# Patient Record
Sex: Male | Born: 1970 | Race: Black or African American | Hispanic: No | Marital: Married | Smoking: Never smoker
Health system: Southern US, Community
[De-identification: ages and names within clinical notes are randomized; demographics above are authoritative.]

## PROBLEM LIST (undated history)

## (undated) DIAGNOSIS — J45909 Unspecified asthma, uncomplicated: Secondary | ICD-10-CM

## (undated) DIAGNOSIS — F4310 Post-traumatic stress disorder, unspecified: Secondary | ICD-10-CM

## (undated) HISTORY — DX: Unspecified asthma, uncomplicated: J45.909

## (undated) HISTORY — DX: Post-traumatic stress disorder, unspecified: F43.10

---

## 1998-09-06 ENCOUNTER — Emergency Department (HOSPITAL_COMMUNITY): Admission: EM | Admit: 1998-09-06 | Discharge: 1998-09-06 | Payer: Self-pay | Admitting: Emergency Medicine

## 1998-09-06 ENCOUNTER — Encounter: Payer: Self-pay | Admitting: Emergency Medicine

## 2001-01-05 ENCOUNTER — Emergency Department (HOSPITAL_COMMUNITY): Admission: EM | Admit: 2001-01-05 | Discharge: 2001-01-05 | Payer: Self-pay | Admitting: *Deleted

## 2001-07-07 ENCOUNTER — Encounter: Admission: RE | Admit: 2001-07-07 | Discharge: 2001-07-07 | Payer: Self-pay | Admitting: *Deleted

## 2001-07-07 ENCOUNTER — Encounter: Payer: Self-pay | Admitting: *Deleted

## 2011-02-18 ENCOUNTER — Other Ambulatory Visit: Payer: Self-pay | Admitting: Orthopaedic Surgery

## 2011-02-18 DIAGNOSIS — R52 Pain, unspecified: Secondary | ICD-10-CM

## 2011-02-22 ENCOUNTER — Other Ambulatory Visit: Payer: Self-pay

## 2011-10-17 HISTORY — PX: SHOULDER SURGERY: SHX246

## 2013-04-07 DIAGNOSIS — Z0289 Encounter for other administrative examinations: Secondary | ICD-10-CM

## 2016-05-16 DEATH — deceased

## 2016-10-16 ENCOUNTER — Ambulatory Visit (INDEPENDENT_AMBULATORY_CARE_PROVIDER_SITE_OTHER): Payer: Worker's Compensation | Admitting: Family Medicine

## 2016-10-16 VITALS — BP 110/82 | HR 94 | Wt 281.0 lb

## 2016-10-16 DIAGNOSIS — M545 Low back pain: Secondary | ICD-10-CM | POA: Diagnosis not present

## 2016-10-16 DIAGNOSIS — M62830 Muscle spasm of back: Secondary | ICD-10-CM | POA: Diagnosis not present

## 2016-10-16 MED ORDER — IBUPROFEN 800 MG PO TABS: 800.0000 mg | ORAL_TABLET | Freq: Three times a day (TID) | ORAL | 0 refills | Status: DC | PRN

## 2016-10-16 MED ORDER — CYCLOBENZAPRINE HCL 10 MG PO TABS: 10.0000 mg | ORAL_TABLET | Freq: Three times a day (TID) | ORAL | 0 refills | Status: DC | PRN

## 2016-10-16 NOTE — Progress Notes (Signed)
Fred Berg 09/14/1971 45 y.o.   Chief Complaint  Patient presents with  . Back Pain    low back after lifting computers     Date of Injury: 10/16/16  History of Present Illness:  Presents for evaluation of work-related complaint.  Patient was at work today at 9:30 when he Attempted to pick up a stack of computers. He works in [employer]. 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 He does wear a back brace.  Patient states he was bent over picking up a stack of boxes when he felt sudden sharp stabbing pain left lumbar region. He had to go sit down. He was able to continue worked her in the day. However pain continued. Started by 2:30 he had to go home due to the degree of pain he was having.  He describes dull aching alternated with sharp stabbing pain bilateral lumbar region but worsened left hand side. Does not radiate. No bladder bowel incontinence. Has not tried taking anything for pain relief. No prior history of back issues or back pain. No prior back injuries.   ROS The patient denies fever, unusual weight change, decreased hearing, chest pain, palpitations, pre-syncopal or syncopal episodes, dyspnea on exertion, prolonged cough, change in walking.    Current medications and allergies reviewed and updated. Past medical history, family history, social history have been reviewed and updated.   Physical Exam Gen:  Alert, cooperative patient who appears stated age in no acute distress.  Vital signs reviewed. Head: [place]/AT.   Eyes:  EOMI, PERRL.   Ears:  External ears WNL, Bilateral TM's normal without retraction, redness or bulging. Nose:  Septum midline  Mouth:  MMM, tonsils non-erythematous, non-edematous.   MSK: Good strength bilateral upper lower extremities. Back:  Normal skin, Spine with normal alignment and no deformity.  No tenderness to vertebral process palpation.  Paraspinous muscles are tender more on the Right and  palpable spasm here.   Range of motion is full at neck and decreased forward flexion to only about 30 degrees in lumbar sacral region due to pain.  Straight leg raise is positive for ipsilateral pain BL.  Neuro:  Sensation and motor function 5/5 bilateral lower extremities.  Patellar  DTR's +1 patellar BL.  He is able to walk on his heels and toes without difficulty.  Skin:  0.4 cm in size skin tab Right lumbar region, non irritated    Assessment and Plan: 1.  Back spasm/lumbar strain: - occurred while at rest.  I did complete his worker's comp paperwork.  He carries heavy boxes at work and states that the pain is worse with walking. - Even without the heavy lifting, he still has to be on his feet most of the day.  He assumes he'd be ready to go back to work within next 5 days or so, which would be Monday.  - Recommendation is to hold from work due to degree of walking he has to do.  States there is otherwise no low-level/sedentary work.  FU on Saturday to ensure he can be cleared for Monday.   - NSAIDs and Flexeril and heat and massage for symptomatic relief. - No red flags

## 2016-10-16 NOTE — Patient Instructions (Addendum)
You have a bad muscle spasm in your lower back.  This is causing the pain you are feeling.  Take the Ibuprofen 800 mg every 8 hours or so for pain relief and anti-inflammatory effects.  Take the Flexeril as a muscle relaxer at night. This may make you drowsy and if it does do not take it during the day.  Use ice for the next day or so.  After that, Heat and massage are also great to help relieve the pain.  This can last for the next 7-10 days. If you're still having issues in the next 2 weeks come back and see us.  If you start having worsening pain despite the treatment don't wait and come back immediately.   Back Pain, Adult Back pain is very common in adults.The cause of back pain is rarely dangerous and the pain often gets better over time.The cause of your back pain may not be known. Some common causes of back pain include:  Strain of the muscles or ligaments supporting the spine.  Wear and tear (degeneration) of the spinal disks.  Arthritis.  Direct injury to the back. For many people, back pain may return. Since back pain is rarely dangerous, most people can learn to manage this condition on their own. HOME CARE INSTRUCTIONS Watch your back pain for any changes. The following actions may help to lessen any discomfort you are feeling:  Remain active. It is stressful on your back to sit or stand in one place for long periods of time. Do not sit, drive, or stand in one place for more than 30 minutes at a time. Take short walks on even surfaces as soon as you are able.Try to increase the length of time you walk each day.  Exercise regularly as directed by your health care provider. Exercise helps your back heal faster. It also helps avoid future injury by keeping your muscles strong and flexible.  Do not stay in bed.Resting more than 1-2 days can delay your recovery.  Pay attention to your body when you bend and lift. The most comfortable positions are those that put less stress  on your recovering back. Always use proper lifting techniques, including:  Bending your knees.  Keeping the load close to your body.  Avoiding twisting.  Find a comfortable position to sleep. Use a firm mattress and lie on your side with your knees slightly bent. If you lie on your back, put a pillow under your knees.  Avoid feeling anxious or stressed.Stress increases muscle tension and can worsen back pain.It is important to recognize when you are anxious or stressed and learn ways to manage it, such as with exercise.  Take medicines only as directed by your health care provider. Over-the-counter medicines to reduce pain and inflammation are often the most helpful.Your health care provider may prescribe muscle relaxant drugs.These medicines help dull your pain so you can more quickly return to your normal activities and healthy exercise.  Apply ice to the injured area:  Put ice in a plastic bag.  Place a towel between your skin and the bag.  Leave the ice on for 20 minutes, 2-3 times a day for the first 2-3 days. After that, ice and heat may be alternated to reduce pain and spasms.  Maintain a healthy weight. Excess weight puts extra stress on your back and makes it difficult to maintain good posture. SEEK MEDICAL CARE IF:  You have pain that is not relieved with rest or medicine.  You  have increasing pain going down into the legs or buttocks.  You have pain that does not improve in one week.  You have night pain.  You lose weight.  You have a fever or chills. SEEK IMMEDIATE MEDICAL CARE IF:   You develop new bowel or bladder control problems.  You have unusual weakness or numbness in your arms or legs.  You develop nausea or vomiting.  You develop abdominal pain.  You feel faint.   This information is not intended to replace advice given to you by your health care provider. Make sure you discuss any questions you have with your health care provider.   Document  Released: 12/02/2005 Document Revised: 12/23/2014 Document Reviewed: 04/05/2014 Elsevier Interactive Patient Education [employer]2016 [employer] Inc.

## 2016-10-19 ENCOUNTER — Ambulatory Visit (INDEPENDENT_AMBULATORY_CARE_PROVIDER_SITE_OTHER): Payer: Worker's Compensation | Admitting: Family Medicine

## 2016-10-19 ENCOUNTER — Ambulatory Visit (INDEPENDENT_AMBULATORY_CARE_PROVIDER_SITE_OTHER): Payer: Worker's Compensation

## 2016-10-19 VITALS — BP 112/68 | HR 62 | Temp 98.5°F | Resp 18 | Ht 72.0 in | Wt 282.0 lb

## 2016-10-19 DIAGNOSIS — S39012D Strain of muscle, fascia and tendon of lower back, subsequent encounter: Secondary | ICD-10-CM | POA: Diagnosis not present

## 2016-10-19 NOTE — Progress Notes (Signed)
Subjective:   By signing my name below, I, Fred Berg, attest that this documentation has been prepared under the direction and in the presence of Fred SorensonEva Ruthene Methvin, MD.  Electronically Signed: Andrew Auaven Berg, ED Scribe. 10/19/2016. 10:24 AM.   Patient ID: Fred Berg, male    DOB: 04/14/1971, 45 y.o.   MRN: [id number]  HPI Chief Complaint  Patient presents with  . Follow-up    wc/back pain    HPI Comments: Fred BankKevin A Revard is a 45 y.o. male who presents to the [facility] for a worker's comp follow up. Initially seen 3 days prior for injury, same day. Picking up heavy box he developed sudden sharp, stabbing lumbar pain. Over the next 5 hours pain continued to progressed. He was taken out of work for complete rest, with no walking or lifting. Advised NSAID, flexeril, heat and massage. His exam noted restricted flexion, right lumbar paraspinal spasm and right positive straight leg raise.    Since last visit pt has had improved right worse than left low back pain that he now describes as soreness. He rates initial pain at last visit 10/10 but current pain a 6-7/10. He reports pain with forward flexion with occasional radiating pain down left leg. He as been taking an NSAID 3 times a day, flexeril as well ice and heat. Although flexeril has helped with pain, he reports drowsiness with medication. He has also limited his activity with walking short distances, gentle stretches and rest.  He denies weakness and numbness in legs, bladder and bowel incontinence.   There are no active problems to display for this patient.  History reviewed. No pertinent past medical history. History reviewed. No pertinent surgical history. No Known Allergies Prior to Admission medications   Medication Sig Start Date End Date Taking? Authorizing Provider  cyclobenzaprine (FLEXERIL) 10 MG tablet Take 1 tablet (10 mg total) by mouth 3 (three) times daily as needed for muscle spasms. 10/16/16  Yes Tobey GrimJeffrey H  Walden, MD  ibuprofen (ADVIL,MOTRIN) 800 MG tablet Take 1 tablet (800 mg total) by mouth every 8 (eight) hours as needed. 10/16/16  Yes Tobey GrimJeffrey H Walden, MD   Social History   Social History  . Marital status: Married    Spouse name: N/A  . Number of children: N/A  . Years of education: N/A   Occupational History  . Not on file.   Social History Main Topics  . Smoking status: Never Smoker  . Smokeless tobacco: Never Used  . Alcohol use Yes  . Drug use: No  . Sexual activity: Not on file   Other Topics Concern  . Not on file   Social History Narrative  . No narrative on file   Review of Systems  Genitourinary: Positive for frequency. Negative for enuresis.  Musculoskeletal: Positive for back pain and myalgias. Negative for gait problem.  Neurological: Negative for weakness and numbness.   Objective:   Physical Exam  Constitutional: He is oriented to person, place, and time. He appears well-developed and well-nourished. No distress.  HENT:  Head: Normocephalic and atraumatic.  Eyes: Conjunctivae and EOM are normal.  Neck: Neck supple.  Cardiovascular: Normal rate.   Pulmonary/Chest: Effort normal.  Musculoskeletal: Normal range of motion.  No spot tenderness on lumbar spine or paraspinal muscles. No tenderness over SI. Right paraspinal tightness. 90 degrees flexion.30 degrees extension.  Mild limitation to left lateral flexion and rotation. 4+/5 on left plantar and dorsal flexion, 5/5 throughout.   Neurological: He  is alert and oriented to person, place, and time.  Skin: Skin is warm and dry.  Psychiatric: He has a normal mood and affect. His behavior is normal.  Nursing note and vitals reviewed.   Vitals:   10/19/16 0944  BP: 112/68  Pulse: 62  Resp: 18  Temp: 98.5 F (36.9 C)  TempSrc: Oral  SpO2: 98%  Weight: 282 lb (127.9 kg)  Height: 6' (1.829 m)   Dg Lumbar Spine 2-3 Views  Result Date: 10/19/2016 CLINICAL DATA:  Twisting injury at work with low back  pain. EXAM: LUMBAR SPINE - 2-3 VIEW COMPARISON:  None. FINDINGS: There is no evidence of lumbar spine fracture. Alignment is normal. Intervertebral disc spaces are maintained. IMPRESSION: Negative. Electronically Signed   By: Amie Portlandavid  Ormond M.D.   On: 10/19/2016 11:25    Assessment & Plan:    1. Strain of lumbar paraspinal muscle, subsequent encounter   Worker's comp. Ok to go back to work on [employer] (2d) on light duty. Recheck in 3-4d - hopefully to ensure he is continuing to improve and loosen work duty restrictions. Cont prn ibuprofen and flexeril.  Orders Placed This Encounter  Procedures  . DG Lumbar Spine 2-3 Views    Standing Status:   Future    Number of Occurrences:   1    Standing Expiration Date:   10/19/2017    Order Specific Question:   Reason for Exam (SYMPTOM  OR DIAGNOSIS REQUIRED)    Answer:   lumbar strain 3d prior wiht left radiculopathy/sciatica    Order Specific Question:   Preferred imaging location?    Answer:   External     I personally performed the services described in this documentation, which was scribed in my presence. The recorded information has been reviewed and considered, and addended by me as needed.   Fred SorensonEva Dannilynn Berg, M.D.  [facility]  [place] [address] [place], [place] [address] [phone] phone [phone] fax  10/21/16 12:11 AM

## 2016-10-19 NOTE — Patient Instructions (Addendum)
   IF you received an x-ray today, you will receive an invoice from [facility]. Please contact  Radiology at [phone] with questions or concerns regarding your invoice.   IF you received labwork today, you will receive an invoice from [facility]. Please contact [facility] at [phone] with questions or concerns regarding your invoice.   Our billing staff will not be able to assist you with questions regarding bills from these companies.  You will be contacted with the lab results as soon as they are available. The fastest way to get your results is to activate your My Chart account. Instructions are located on the last page of this paperwork. If you have not heard from us regarding the results in 2 weeks, please contact this office.     Low Back Strain With Rehab A strain is an injury in which a tendon or muscle is torn. The muscles and tendons of the lower back are vulnerable to strains. However, these muscles and tendons are very strong and require a great force to be injured. Strains are classified into three categories. Grade 1 strains cause pain, but the tendon is not lengthened. Grade 2 strains include a lengthened ligament, due to the ligament being stretched or partially ruptured. With grade 2 strains there is still function, although the function may be decreased. Grade 3 strains involve a complete tear of the tendon or muscle, and function is usually impaired. SYMPTOMS   Pain in the lower back.  Pain that affects one side more than the other.  Pain that gets worse with movement and may be felt in the hip, buttocks, or back of the thigh.  Muscle spasms of the muscles in the back.  Swelling along the muscles of the back.  Loss of strength of the back muscles.  Crackling sound (crepitation) when the muscles are touched. CAUSES  Lower back strains occur when a force is placed on the muscles or tendons that is greater than they  can handle. Common causes of injury include:  Prolonged overuse of the muscle-tendon units in the lower back, usually from incorrect posture.  A single violent injury or force applied to the back. RISK INCREASES WITH:  Sports that involve twisting forces on the spine or a lot of bending at the waist (football, rugby, weightlifting, bowling, golf, tennis, speed skating, racquetball, swimming, running, gymnastics, diving).  Poor strength and flexibility.  Failure to warm up properly before activity.  Family history of lower back pain or disk disorders.  Previous back injury or surgery (especially fusion).  Poor posture with lifting, especially heavy objects.  Prolonged sitting, especially with poor posture. PREVENTION   Learn and use proper posture when sitting or lifting (maintain proper posture when sitting, lift using the knees and legs, not at the waist).  Warm up and stretch properly before activity.  Allow for adequate recovery between workouts.  Maintain physical fitness:  Strength, flexibility, and endurance.  Cardiovascular fitness. PROGNOSIS  If treated properly, lower back strains usually heal within 6 weeks. RELATED COMPLICATIONS   Recurring symptoms, resulting in a chronic problem.  Chronic inflammation, scarring, and partial muscle-tendon tear.  Delayed healing or resolution of symptoms.  Prolonged disability. TREATMENT  Treatment first involves the use of ice and medicine, to reduce pain and inflammation. The use of strengthening and stretching exercises may help reduce pain with activity. These exercises may be performed at home or with a therapist. Severe injuries may require referral to a therapist for further   evaluation and treatment, such as ultrasound. Your caregiver may advise that you wear a back brace or corset, to help reduce pain and discomfort. Often, prolonged bed rest results in greater harm then benefit. Corticosteroid injections may be  recommended. However, these should be reserved for the most serious cases. It is important to avoid using your back when lifting objects. At night, sleep on your back on a firm mattress with a pillow placed under your knees. If non-surgical treatment is unsuccessful, surgery may be needed.  MEDICATION   If pain medicine is needed, nonsteroidal anti-inflammatory medicines (aspirin and ibuprofen), or other minor pain relievers (acetaminophen), are often advised.  Do not take pain medicine for 7 days before surgery.  Prescription pain relievers may be given, if your caregiver thinks they are needed. Use only as directed and only as much as you need.  Ointments applied to the skin may be helpful.  Corticosteroid injections may be given by your caregiver. These injections should be reserved for the most serious cases, because they may only be given a certain number of times. HEAT AND COLD  Cold treatment (icing) should be applied for 10 to 15 minutes every 2 to 3 hours for inflammation and pain, and immediately after activity that aggravates your symptoms. Use ice packs or an ice massage.  Heat treatment may be used before performing stretching and strengthening activities prescribed by your caregiver, physical therapist, or athletic trainer. Use a heat pack or a warm water soak. SEEK MEDICAL CARE IF:   Symptoms get worse or do not improve in 2 to 4 weeks, despite treatment.  You develop numbness, weakness, or loss of bowel or bladder function.  New, unexplained symptoms develop. (Drugs used in treatment may produce side effects.) EXERCISES  RANGE OF MOTION (ROM) AND STRETCHING EXERCISES - Low Back Strain Most people with lower back pain will find that their symptoms get worse with excessive bending forward (flexion) or arching at the lower back (extension). The exercises which will help resolve your symptoms will focus on the opposite motion.  Your physician, physical therapist or athletic  trainer will help you determine which exercises will be most helpful to resolve your lower back pain. Do not complete any exercises without first consulting with your caregiver. Discontinue any exercises which make your symptoms worse until you speak to your caregiver.  If you have pain, numbness or tingling which travels down into your buttocks, leg or foot, the goal of the therapy is for these symptoms to move closer to your back and eventually resolve. Sometimes, these leg symptoms will get better, but your lower back pain may worsen. This is typically an indication of progress in your rehabilitation. Be very alert to any changes in your symptoms and the activities in which you participated in the 24 hours prior to the change. Sharing this information with your caregiver will allow him/her to most efficiently treat your condition.  These exercises may help you when beginning to rehabilitate your injury. Your symptoms may resolve with or without further involvement from your physician, physical therapist or athletic trainer. While completing these exercises, remember:  Restoring tissue flexibility helps normal motion to return to the joints. This allows healthier, less painful movement and activity.  An effective stretch should be held for at least 30 seconds.  A stretch should never be painful. You should only feel a gentle lengthening or release in the stretched tissue. FLEXION RANGE OF MOTION AND STRETCHING EXERCISES: STRETCH - Flexion, Single Knee to Chest     Lie on a firm bed or floor with both legs extended in front of you.  Keeping one leg in contact with the floor, bring your opposite knee to your chest. Hold your leg in place by either grabbing behind your thigh or at your knee.  Pull until you feel a gentle stretch in your lower back. Hold __________ seconds.  Slowly release your grasp and repeat the exercise with the opposite side. Repeat __________ times. Complete this exercise  __________ times per day.  STRETCH - Flexion, Double Knee to Chest   Lie on a firm bed or floor with both legs extended in front of you.  Keeping one leg in contact with the floor, bring your opposite knee to your chest.  Tense your stomach muscles to support your back and then lift your other knee to your chest. Hold your legs in place by either grabbing behind your thighs or at your knees.  Pull both knees toward your chest until you feel a gentle stretch in your lower back. Hold __________ seconds.  Tense your stomach muscles and slowly return one leg at a time to the floor. Repeat __________ times. Complete this exercise __________ times per day.  STRETCH - Low Trunk Rotation  Lie on a firm bed or floor. Keeping your legs in front of you, bend your knees so they are both pointed toward the ceiling and your feet are flat on the floor.  Extend your arms out to the side. This will stabilize your upper body by keeping your shoulders in contact with the floor.  Gently and slowly drop both knees together to one side until you feel a gentle stretch in your lower back. Hold for __________ seconds.  Tense your stomach muscles to support your lower back as you bring your knees back to the starting position. Repeat the exercise to the other side. Repeat __________ times. Complete this exercise __________ times per day  EXTENSION RANGE OF MOTION AND FLEXIBILITY EXERCISES: STRETCH - Extension, Prone on Elbows   Lie on your stomach on the floor, a bed will be too soft. Place your palms about shoulder width apart and at the height of your head.  Place your elbows under your shoulders. If this is too painful, stack pillows under your chest.  Allow your body to relax so that your hips drop lower and make contact more completely with the floor.  Hold this position for __________ seconds.  Slowly return to lying flat on the floor. Repeat __________ times. Complete this exercise __________ times  per day.  RANGE OF MOTION - Extension, Prone Press Ups  Lie on your stomach on the floor, a bed will be too soft. Place your palms about shoulder width apart and at the height of your head.  Keeping your back as relaxed as possible, slowly straighten your elbows while keeping your hips on the floor. You may adjust the placement of your hands to maximize your comfort. As you gain motion, your hands will come more underneath your shoulders.  Hold this position __________ seconds.  Slowly return to lying flat on the floor. Repeat __________ times. Complete this exercise __________ times per day.  RANGE OF MOTION- Quadruped, Neutral Spine   Assume a hands and knees position on a firm surface. Keep your hands under your shoulders and your knees under your hips. You may place padding under your knees for comfort.  Drop your head and point your tail bone toward the ground below you. This will round out   your lower back like an angry cat. Hold this position for __________ seconds.  Slowly lift your head and release your tail bone so that your back sags into a large arch, like an old horse.  Hold this position for __________ seconds.  Repeat this until you feel limber in your lower back.  Now, find your "sweet spot." This will be the most comfortable position somewhere between the two previous positions. This is your neutral spine. Once you have found this position, tense your stomach muscles to support your lower back.  Hold this position for __________ seconds. Repeat __________ times. Complete this exercise __________ times per day.  STRENGTHENING EXERCISES - Low Back Strain These exercises may help you when beginning to rehabilitate your injury. These exercises should be done near your "sweet spot." This is the neutral, low-back arch, somewhere between fully rounded and fully arched, that is your least painful position. When performed in this safe range of motion, these exercises can be used  for people who have either a flexion or extension based injury. These exercises may resolve your symptoms with or without further involvement from your physician, physical therapist or athletic trainer. While completing these exercises, remember:   Muscles can gain both the endurance and the strength needed for everyday activities through controlled exercises.  Complete these exercises as instructed by your physician, physical therapist or athletic trainer. Increase the resistance and repetitions only as guided.  You may experience muscle soreness or fatigue, but the pain or discomfort you are trying to eliminate should never worsen during these exercises. If this pain does worsen, stop and make certain you are following the directions exactly. If the pain is still present after adjustments, discontinue the exercise until you can discuss the trouble with your caregiver. STRENGTHENING - Deep Abdominals, Pelvic Tilt  Lie on a firm bed or floor. Keeping your legs in front of you, bend your knees so they are both pointed toward the ceiling and your feet are flat on the floor.  Tense your lower abdominal muscles to press your lower back into the floor. This motion will rotate your pelvis so that your tail bone is scooping upwards rather than pointing at your feet or into the floor.  With a gentle tension and even breathing, hold this position for __________ seconds. Repeat __________ times. Complete this exercise __________ times per day.  STRENGTHENING - Abdominals, Crunches   Lie on a firm bed or floor. Keeping your legs in front of you, bend your knees so they are both pointed toward the ceiling and your feet are flat on the floor. Cross your arms over your chest.  Slightly tip your chin down without bending your neck.  Tense your abdominals and slowly lift your trunk high enough to just clear your shoulder blades. Lifting higher can put excessive stress on the lower back and does not further  strengthen your abdominal muscles.  Control your return to the starting position. Repeat __________ times. Complete this exercise __________ times per day.  STRENGTHENING - Quadruped, Opposite UE/LE Lift   Assume a hands and knees position on a firm surface. Keep your hands under your shoulders and your knees under your hips. You may place padding under your knees for comfort.  Find your neutral spine and gently tense your abdominal muscles so that you can maintain this position. Your shoulders and hips should form a rectangle that is parallel with the floor and is not twisted.  Keeping your trunk steady, lift your right   hand no higher than your shoulder and then your left leg no higher than your hip. Make sure you are not holding your breath. Hold this position __________ seconds.  Continuing to keep your abdominal muscles tense and your back steady, slowly return to your starting position. Repeat with the opposite arm and leg. Repeat __________ times. Complete this exercise __________ times per day.  STRENGTHENING - Lower Abdominals, Double Knee Lift  Lie on a firm bed or floor. Keeping your legs in front of you, bend your knees so they are both pointed toward the ceiling and your feet are flat on the floor.  Tense your abdominal muscles to brace your lower back and slowly lift both of your knees until they come over your hips. Be certain not to hold your breath.  Hold __________ seconds. Using your abdominal muscles, return to the starting position in a slow and controlled manner. Repeat __________ times. Complete this exercise __________ times per day.  POSTURE AND BODY MECHANICS CONSIDERATIONS - Low Back Strain Keeping correct posture when sitting, standing or completing your activities will reduce the stress put on different body tissues, allowing injured tissues a chance to heal and limiting painful experiences. The following are general guidelines for improved posture. Your physician  or physical therapist will provide you with any instructions specific to your needs. While reading these guidelines, remember:  The exercises prescribed by your provider will help you have the flexibility and strength to maintain correct postures.  The correct posture provides the best environment for your joints to work. All of your joints have less wear and tear when properly supported by a spine with good posture. This means you will experience a healthier, less painful body.  Correct posture must be practiced with all of your activities, especially prolonged sitting and standing. Correct posture is as important when doing repetitive low-stress activities (typing) as it is when doing a single heavy-load activity (lifting). RESTING POSITIONS Consider which positions are most painful for you when choosing a resting position. If you have pain with flexion-based activities (sitting, bending, stooping, squatting), choose a position that allows you to rest in a less flexed posture. You would want to avoid curling into a fetal position on your side. If your pain worsens with extension-based activities (prolonged standing, working overhead), avoid resting in an extended position such as sleeping on your stomach. Most people will find more comfort when they rest with their spine in a more neutral position, neither too rounded nor too arched. Lying on a non-sagging bed on your side with a pillow between your knees, or on your back with a pillow under your knees will often provide some relief. Keep in mind, being in any one position for a prolonged period of time, no matter how correct your posture, can still lead to stiffness. PROPER SITTING POSTURE In order to minimize stress and discomfort on your spine, you must sit with correct posture. Sitting with good posture should be effortless for a healthy body. Returning to good posture is a gradual process. Many people can work toward this most comfortably by using  various supports until they have the flexibility and strength to maintain this posture on their own. When sitting with proper posture, your ears will fall over your shoulders and your shoulders will fall over your hips. You should use the back of the chair to support your upper back. Your lower back will be in a neutral position, just slightly arched. You may place a small pillow or   folded towel at the base of your lower back for support.  When working at a desk, create an environment that supports good, upright posture. Without extra support, muscles tire, which leads to excessive strain on joints and other tissues. Keep these recommendations in mind: CHAIR:  A chair should be able to slide under your desk when your back makes contact with the back of the chair. This allows you to work closely.  The chair's height should allow your eyes to be level with the upper part of your monitor and your hands to be slightly lower than your elbows. BODY POSITION  Your feet should make contact with the floor. If this is not possible, use a foot rest.  Keep your ears over your shoulders. This will reduce stress on your neck and lower back. INCORRECT SITTING POSTURES  If you are feeling tired and unable to assume a healthy sitting posture, do not slouch or slump. This puts excessive strain on your back tissues, causing more damage and pain. Healthier options include:  Using more support, like a lumbar pillow.  Switching tasks to something that requires you to be upright or walking.  Talking a brief walk.  Lying down to rest in a neutral-spine position. PROLONGED STANDING WHILE SLIGHTLY LEANING FORWARD  When completing a task that requires you to lean forward while standing in one place for a long time, place either foot up on a stationary 2-4 inch high object to help maintain the best posture. When both feet are on the ground, the lower back tends to lose its slight inward curve. If this curve flattens (or  becomes too large), then the back and your other joints will experience too much stress, tire more quickly, and can cause pain. CORRECT STANDING POSTURES Proper standing posture should be assumed with all daily activities, even if they only take a few moments, like when brushing your teeth. As in sitting, your ears should fall over your shoulders and your shoulders should fall over your hips. You should keep a slight tension in your abdominal muscles to brace your spine. Your tailbone should point down to the ground, not behind your body, resulting in an over-extended swayback posture.  INCORRECT STANDING POSTURES  Common incorrect standing postures include a forward head, locked knees and/or an excessive swayback. WALKING Walk with an upright posture. Your ears, shoulders and hips should all line-up. PROLONGED ACTIVITY IN A FLEXED POSITION When completing a task that requires you to bend forward at your waist or lean over a low surface, try to find a way to stabilize 3 out of 4 of your limbs. You can place a hand or elbow on your thigh or rest a knee on the surface you are reaching across. This will provide you more stability so that your muscles do not fatigue as quickly. By keeping your knees relaxed, or slightly bent, you will also reduce stress across your lower back. CORRECT LIFTING TECHNIQUES DO :   Assume a wide stance. This will provide you more stability and the opportunity to get as close as possible to the object which you are lifting.  Tense your abdominals to brace your spine. Bend at the knees and hips. Keeping your back locked in a neutral-spine position, lift using your leg muscles. Lift with your legs, keeping your back straight.  Test the weight of unknown objects before attempting to lift them.  Try to keep your elbows locked down at your sides in order get the best strength from your   shoulders when carrying an object.  Always ask for help when lifting heavy or awkward  objects. INCORRECT LIFTING TECHNIQUES DO NOT:   Lock your knees when lifting, even if it is a small object.  Bend and twist. Pivot at your feet or move your feet when needing to change directions.  Assume that you can safely pick up even a paper clip without proper posture.   This information is not intended to replace advice given to you by your health care provider. Make sure you discuss any questions you have with your health care provider.   Document Released: 12/02/2005 Document Revised: 12/23/2014 Document Reviewed: 03/16/2009 [employer] Interactive Patient Education 2016 [employer]   

## 2016-10-23 ENCOUNTER — Ambulatory Visit (INDEPENDENT_AMBULATORY_CARE_PROVIDER_SITE_OTHER): Payer: Worker's Compensation | Admitting: Family Medicine

## 2016-10-23 VITALS — BP 110/68 | HR 68 | Temp 98.2°F | Resp 18 | Ht 72.0 in | Wt 288.0 lb

## 2016-10-23 DIAGNOSIS — M545 Low back pain: Secondary | ICD-10-CM | POA: Diagnosis not present

## 2016-10-23 DIAGNOSIS — M62830 Muscle spasm of back: Secondary | ICD-10-CM | POA: Diagnosis not present

## 2016-10-23 DIAGNOSIS — S39012D Strain of muscle, fascia and tendon of lower back, subsequent encounter: Secondary | ICD-10-CM | POA: Diagnosis not present

## 2016-10-23 MED ORDER — TRAMADOL HCL 50 MG PO TABS: 50.0000 mg | ORAL_TABLET | Freq: Every evening | ORAL | 0 refills | Status: DC | PRN

## 2016-10-23 NOTE — Patient Instructions (Addendum)
   IF you received an x-ray today, you will receive an invoice from [facility]. Please contact [facility] at [phone] with questions or concerns regarding your invoice.   IF you received labwork today, you will receive an invoice from [facility]. Please contact [facility] at [phone] with questions or concerns regarding your invoice.   Our billing staff will not be able to assist you with questions regarding bills from these companies.  You will be contacted with the lab results as soon as they are available. The fastest way to get your results is to activate your My Chart account. Instructions are located on the last page of this paperwork. If you have not heard from us regarding the results in 2 weeks, please contact this office.     Low Back Strain With Rehab A strain is an injury in which a tendon or muscle is torn. The muscles and tendons of the lower back are vulnerable to strains. However, these muscles and tendons are very strong and require a great force to be injured. Strains are classified into three categories. Grade 1 strains cause pain, but the tendon is not lengthened. Grade 2 strains include a lengthened ligament, due to the ligament being stretched or partially ruptured. With grade 2 strains there is still function, although the function may be decreased. Grade 3 strains involve a complete tear of the tendon or muscle, and function is usually impaired. SYMPTOMS   Pain in the lower back.  Pain that affects one side more than the other.  Pain that gets worse with movement and may be felt in the hip, buttocks, or back of the thigh.  Muscle spasms of the muscles in the back.  Swelling along the muscles of the back.  Loss of strength of the back muscles.  Crackling sound (crepitation) when the muscles are touched. CAUSES  Lower back strains occur when a force is placed on the muscles or tendons that is greater than they  can handle. Common causes of injury include:  Prolonged overuse of the muscle-tendon units in the lower back, usually from incorrect posture.  A single violent injury or force applied to the back. RISK INCREASES WITH:  Sports that involve twisting forces on the spine or a lot of bending at the waist (football, rugby, weightlifting, bowling, golf, tennis, speed skating, racquetball, swimming, running, gymnastics, diving).  Poor strength and flexibility.  Failure to warm up properly before activity.  Family history of lower back pain or disk disorders.  Previous back injury or surgery (especially fusion).  Poor posture with lifting, especially heavy objects.  Prolonged sitting, especially with poor posture. PREVENTION   Learn and use proper posture when sitting or lifting (maintain proper posture when sitting, lift using the knees and legs, not at the waist).  Warm up and stretch properly before activity.  Allow for adequate recovery between workouts.  Maintain physical fitness:  Strength, flexibility, and endurance.  Cardiovascular fitness. PROGNOSIS  If treated properly, lower back strains usually heal within 6 weeks. RELATED COMPLICATIONS   Recurring symptoms, resulting in a chronic problem.  Chronic inflammation, scarring, and partial muscle-tendon tear.  Delayed healing or resolution of symptoms.  Prolonged disability. TREATMENT  Treatment first involves the use of ice and medicine, to reduce pain and inflammation. The use of strengthening and stretching exercises may help reduce pain with activity. These exercises may be performed at home or with a therapist. Severe injuries may require referral to a therapist for further   evaluation and treatment, such as ultrasound. Your caregiver may advise that you wear a back brace or corset, to help reduce pain and discomfort. Often, prolonged bed rest results in greater harm then benefit. Corticosteroid injections may be  recommended. However, these should be reserved for the most serious cases. It is important to avoid using your back when lifting objects. At night, sleep on your back on a firm mattress with a pillow placed under your knees. If non-surgical treatment is unsuccessful, surgery may be needed.  MEDICATION   If pain medicine is needed, nonsteroidal anti-inflammatory medicines (aspirin and ibuprofen), or other minor pain relievers (acetaminophen), are often advised.  Do not take pain medicine for 7 days before surgery.  Prescription pain relievers may be given, if your caregiver thinks they are needed. Use only as directed and only as much as you need.  Ointments applied to the skin may be helpful.  Corticosteroid injections may be given by your caregiver. These injections should be reserved for the most serious cases, because they may only be given a certain number of times. HEAT AND COLD  Cold treatment (icing) should be applied for 10 to 15 minutes every 2 to 3 hours for inflammation and pain, and immediately after activity that aggravates your symptoms. Use ice packs or an ice massage.  Heat treatment may be used before performing stretching and strengthening activities prescribed by your caregiver, physical therapist, or athletic trainer. Use a heat pack or a warm water soak. SEEK MEDICAL CARE IF:   Symptoms get worse or do not improve in 2 to 4 weeks, despite treatment.  You develop numbness, weakness, or loss of bowel or bladder function.  New, unexplained symptoms develop. (Drugs used in treatment may produce side effects.) EXERCISES  RANGE OF MOTION (ROM) AND STRETCHING EXERCISES - Low Back Strain Most people with lower back pain will find that their symptoms get worse with excessive bending forward (flexion) or arching at the lower back (extension). The exercises which will help resolve your symptoms will focus on the opposite motion.  Your physician, physical therapist or athletic  trainer will help you determine which exercises will be most helpful to resolve your lower back pain. Do not complete any exercises without first consulting with your caregiver. Discontinue any exercises which make your symptoms worse until you speak to your caregiver.  If you have pain, numbness or tingling which travels down into your buttocks, leg or foot, the goal of the therapy is for these symptoms to move closer to your back and eventually resolve. Sometimes, these leg symptoms will get better, but your lower back pain may worsen. This is typically an indication of progress in your rehabilitation. Be very alert to any changes in your symptoms and the activities in which you participated in the 24 hours prior to the change. Sharing this information with your caregiver will allow him/her to most efficiently treat your condition.  These exercises may help you when beginning to rehabilitate your injury. Your symptoms may resolve with or without further involvement from your physician, physical therapist or athletic trainer. While completing these exercises, remember:  Restoring tissue flexibility helps normal motion to return to the joints. This allows healthier, less painful movement and activity.  An effective stretch should be held for at least 30 seconds.  A stretch should never be painful. You should only feel a gentle lengthening or release in the stretched tissue. FLEXION RANGE OF MOTION AND STRETCHING EXERCISES: STRETCH - Flexion, Single Knee to Chest     Lie on a firm bed or floor with both legs extended in front of you.  Keeping one leg in contact with the floor, bring your opposite knee to your chest. Hold your leg in place by either grabbing behind your thigh or at your knee.  Pull until you feel a gentle stretch in your lower back. Hold __________ seconds.  Slowly release your grasp and repeat the exercise with the opposite side. Repeat __________ times. Complete this exercise  __________ times per day.  STRETCH - Flexion, Double Knee to Chest   Lie on a firm bed or floor with both legs extended in front of you.  Keeping one leg in contact with the floor, bring your opposite knee to your chest.  Tense your stomach muscles to support your back and then lift your other knee to your chest. Hold your legs in place by either grabbing behind your thighs or at your knees.  Pull both knees toward your chest until you feel a gentle stretch in your lower back. Hold __________ seconds.  Tense your stomach muscles and slowly return one leg at a time to the floor. Repeat __________ times. Complete this exercise __________ times per day.  STRETCH - Low Trunk Rotation  Lie on a firm bed or floor. Keeping your legs in front of you, bend your knees so they are both pointed toward the ceiling and your feet are flat on the floor.  Extend your arms out to the side. This will stabilize your upper body by keeping your shoulders in contact with the floor.  Gently and slowly drop both knees together to one side until you feel a gentle stretch in your lower back. Hold for __________ seconds.  Tense your stomach muscles to support your lower back as you bring your knees back to the starting position. Repeat the exercise to the other side. Repeat __________ times. Complete this exercise __________ times per day  EXTENSION RANGE OF MOTION AND FLEXIBILITY EXERCISES: STRETCH - Extension, Prone on Elbows   Lie on your stomach on the floor, a bed will be too soft. Place your palms about shoulder width apart and at the height of your head.  Place your elbows under your shoulders. If this is too painful, stack pillows under your chest.  Allow your body to relax so that your hips drop lower and make contact more completely with the floor.  Hold this position for __________ seconds.  Slowly return to lying flat on the floor. Repeat __________ times. Complete this exercise __________ times  per day.  RANGE OF MOTION - Extension, Prone Press Ups  Lie on your stomach on the floor, a bed will be too soft. Place your palms about shoulder width apart and at the height of your head.  Keeping your back as relaxed as possible, slowly straighten your elbows while keeping your hips on the floor. You may adjust the placement of your hands to maximize your comfort. As you gain motion, your hands will come more underneath your shoulders.  Hold this position __________ seconds.  Slowly return to lying flat on the floor. Repeat __________ times. Complete this exercise __________ times per day.  RANGE OF MOTION- Quadruped, Neutral Spine   Assume a hands and knees position on a firm surface. Keep your hands under your shoulders and your knees under your hips. You may place padding under your knees for comfort.  Drop your head and point your tail bone toward the ground below you. This will round out   your lower back like an angry cat. Hold this position for __________ seconds.  Slowly lift your head and release your tail bone so that your back sags into a large arch, like an old horse.  Hold this position for __________ seconds.  Repeat this until you feel limber in your lower back.  Now, find your "sweet spot." This will be the most comfortable position somewhere between the two previous positions. This is your neutral spine. Once you have found this position, tense your stomach muscles to support your lower back.  Hold this position for __________ seconds. Repeat __________ times. Complete this exercise __________ times per day.  STRENGTHENING EXERCISES - Low Back Strain These exercises may help you when beginning to rehabilitate your injury. These exercises should be done near your "sweet spot." This is the neutral, low-back arch, somewhere between fully rounded and fully arched, that is your least painful position. When performed in this safe range of motion, these exercises can be used  for people who have either a flexion or extension based injury. These exercises may resolve your symptoms with or without further involvement from your physician, physical therapist or athletic trainer. While completing these exercises, remember:   Muscles can gain both the endurance and the strength needed for everyday activities through controlled exercises.  Complete these exercises as instructed by your physician, physical therapist or athletic trainer. Increase the resistance and repetitions only as guided.  You may experience muscle soreness or fatigue, but the pain or discomfort you are trying to eliminate should never worsen during these exercises. If this pain does worsen, stop and make certain you are following the directions exactly. If the pain is still present after adjustments, discontinue the exercise until you can discuss the trouble with your caregiver. STRENGTHENING - Deep Abdominals, Pelvic Tilt  Lie on a firm bed or floor. Keeping your legs in front of you, bend your knees so they are both pointed toward the ceiling and your feet are flat on the floor.  Tense your lower abdominal muscles to press your lower back into the floor. This motion will rotate your pelvis so that your tail bone is scooping upwards rather than pointing at your feet or into the floor.  With a gentle tension and even breathing, hold this position for __________ seconds. Repeat __________ times. Complete this exercise __________ times per day.  STRENGTHENING - Abdominals, Crunches   Lie on a firm bed or floor. Keeping your legs in front of you, bend your knees so they are both pointed toward the ceiling and your feet are flat on the floor. Cross your arms over your chest.  Slightly tip your chin down without bending your neck.  Tense your abdominals and slowly lift your trunk high enough to just clear your shoulder blades. Lifting higher can put excessive stress on the lower back and does not further  strengthen your abdominal muscles.  Control your return to the starting position. Repeat __________ times. Complete this exercise __________ times per day.  STRENGTHENING - Quadruped, Opposite UE/LE Lift   Assume a hands and knees position on a firm surface. Keep your hands under your shoulders and your knees under your hips. You may place padding under your knees for comfort.  Find your neutral spine and gently tense your abdominal muscles so that you can maintain this position. Your shoulders and hips should form a rectangle that is parallel with the floor and is not twisted.  Keeping your trunk steady, lift your right   hand no higher than your shoulder and then your left leg no higher than your hip. Make sure you are not holding your breath. Hold this position __________ seconds.  Continuing to keep your abdominal muscles tense and your back steady, slowly return to your starting position. Repeat with the opposite arm and leg. Repeat __________ times. Complete this exercise __________ times per day.  STRENGTHENING - Lower Abdominals, Double Knee Lift  Lie on a firm bed or floor. Keeping your legs in front of you, bend your knees so they are both pointed toward the ceiling and your feet are flat on the floor.  Tense your abdominal muscles to brace your lower back and slowly lift both of your knees until they come over your hips. Be certain not to hold your breath.  Hold __________ seconds. Using your abdominal muscles, return to the starting position in a slow and controlled manner. Repeat __________ times. Complete this exercise __________ times per day.  POSTURE AND BODY MECHANICS CONSIDERATIONS - Low Back Strain Keeping correct posture when sitting, standing or completing your activities will reduce the stress put on different body tissues, allowing injured tissues a chance to heal and limiting painful experiences. The following are general guidelines for improved posture. Your physician  or physical therapist will provide you with any instructions specific to your needs. While reading these guidelines, remember:  The exercises prescribed by your provider will help you have the flexibility and strength to maintain correct postures.  The correct posture provides the best environment for your joints to work. All of your joints have less wear and tear when properly supported by a spine with good posture. This means you will experience a healthier, less painful body.  Correct posture must be practiced with all of your activities, especially prolonged sitting and standing. Correct posture is as important when doing repetitive low-stress activities (typing) as it is when doing a single heavy-load activity (lifting). RESTING POSITIONS Consider which positions are most painful for you when choosing a resting position. If you have pain with flexion-based activities (sitting, bending, stooping, squatting), choose a position that allows you to rest in a less flexed posture. You would want to avoid curling into a fetal position on your side. If your pain worsens with extension-based activities (prolonged standing, working overhead), avoid resting in an extended position such as sleeping on your stomach. Most people will find more comfort when they rest with their spine in a more neutral position, neither too rounded nor too arched. Lying on a non-sagging bed on your side with a pillow between your knees, or on your back with a pillow under your knees will often provide some relief. Keep in mind, being in any one position for a prolonged period of time, no matter how correct your posture, can still lead to stiffness. PROPER SITTING POSTURE In order to minimize stress and discomfort on your spine, you must sit with correct posture. Sitting with good posture should be effortless for a healthy body. Returning to good posture is a gradual process. Many people can work toward this most comfortably by using  various supports until they have the flexibility and strength to maintain this posture on their own. When sitting with proper posture, your ears will fall over your shoulders and your shoulders will fall over your hips. You should use the back of the chair to support your upper back. Your lower back will be in a neutral position, just slightly arched. You may place a small pillow or   folded towel at the base of your lower back for support.  When working at a desk, create an environment that supports good, upright posture. Without extra support, muscles tire, which leads to excessive strain on joints and other tissues. Keep these recommendations in mind: CHAIR:  A chair should be able to slide under your desk when your back makes contact with the back of the chair. This allows you to work closely.  The chair's height should allow your eyes to be level with the upper part of your monitor and your hands to be slightly lower than your elbows. BODY POSITION  Your feet should make contact with the floor. If this is not possible, use a foot rest.  Keep your ears over your shoulders. This will reduce stress on your neck and lower back. INCORRECT SITTING POSTURES  If you are feeling tired and unable to assume a healthy sitting posture, do not slouch or slump. This puts excessive strain on your back tissues, causing more damage and pain. Healthier options include:  Using more support, like a lumbar pillow.  Switching tasks to something that requires you to be upright or walking.  Talking a brief walk.  Lying down to rest in a neutral-spine position. PROLONGED STANDING WHILE SLIGHTLY LEANING FORWARD  When completing a task that requires you to lean forward while standing in one place for a long time, place either foot up on a stationary 2-4 inch high object to help maintain the best posture. When both feet are on the ground, the lower back tends to lose its slight inward curve. If this curve flattens (or  becomes too large), then the back and your other joints will experience too much stress, tire more quickly, and can cause pain. CORRECT STANDING POSTURES Proper standing posture should be assumed with all daily activities, even if they only take a few moments, like when brushing your teeth. As in sitting, your ears should fall over your shoulders and your shoulders should fall over your hips. You should keep a slight tension in your abdominal muscles to brace your spine. Your tailbone should point down to the ground, not behind your body, resulting in an over-extended swayback posture.  INCORRECT STANDING POSTURES  Common incorrect standing postures include a forward head, locked knees and/or an excessive swayback. WALKING Walk with an upright posture. Your ears, shoulders and hips should all line-up. PROLONGED ACTIVITY IN A FLEXED POSITION When completing a task that requires you to bend forward at your waist or lean over a low surface, try to find a way to stabilize 3 out of 4 of your limbs. You can place a hand or elbow on your thigh or rest a knee on the surface you are reaching across. This will provide you more stability so that your muscles do not fatigue as quickly. By keeping your knees relaxed, or slightly bent, you will also reduce stress across your lower back. CORRECT LIFTING TECHNIQUES DO :   Assume a wide stance. This will provide you more stability and the opportunity to get as close as possible to the object which you are lifting.  Tense your abdominals to brace your spine. Bend at the knees and hips. Keeping your back locked in a neutral-spine position, lift using your leg muscles. Lift with your legs, keeping your back straight.  Test the weight of unknown objects before attempting to lift them.  Try to keep your elbows locked down at your sides in order get the best strength from your   shoulders when carrying an object.  Always ask for help when lifting heavy or awkward  objects. INCORRECT LIFTING TECHNIQUES DO NOT:   Lock your knees when lifting, even if it is a small object.  Bend and twist. Pivot at your feet or move your feet when needing to change directions.  Assume that you can safely pick up even a paper clip without proper posture.   This information is not intended to replace advice given to you by your health care provider. Make sure you discuss any questions you have with your health care provider.   Document Released: 12/02/2005 Document Revised: 12/23/2014 Document Reviewed: 03/16/2009 [employer] Interactive Patient Education 2016 [employer]   

## 2016-10-23 NOTE — Progress Notes (Signed)
Subjective:  By signing my name below, I, Raven Small, attest that this documentation has been prepared under the direction and in the presence of Norberto SorensonEva Shaw, MD.  Electronically Signed: Andrew Auaven Small, ED Scribe. 10/23/2016. 1:32 PM.   Patient ID: Fred Berg, male    DOB: 01/29/1971, 45 y.o.   MRN: [id number]  HPI Chief Complaint  Patient presents with  . Follow-up    WORKERS COMP ON LOWER BACK    HPI Comments: Fred BankKevin A Henry is a 45 y.o. male who presents to the [facility] for a worker's comp recheck. Follow up injury that occurred 10/16/16, lifting a 50 lb computer box. This is his 3rd visit. Initially he was taken out and went back to work on light duty 2 days prior with prn ibuprofen and flexeril.    Pt reports some improvement since last visit but is still not 100%. He's continues to have low back stiffness. [facility] and [facility] cream that he's found helpful for the past 3 days. He's on light duty at work and has avoided lifting heavy object and climbing steps. Reports pain keeps him awake at night. He's continued ibuprofen and flexeril regimen.  He reports some numbness in left foot as well as left knee pain.   Review of Systems  Musculoskeletal: Positive for back pain and myalgias. Negative for gait problem.  Neurological: Positive for weakness and numbness.       Objective:   Physical Exam  Constitutional: He is oriented to person, place, and time. He appears well-developed and well-nourished. No distress.  HENT:  Head: Normocephalic and atraumatic.  Eyes: Conjunctivae and EOM are normal.  Neck: Neck supple.  Cardiovascular: Normal rate.   Pulmonary/Chest: Effort normal.  Musculoskeletal: Normal range of motion.  Tenderness over L5 spinous process and L5 paraspinal bilaterally. No tenderness over the head of trochanter or SI joint. Negative straight raise bilaterally. 4+/5 strength left dorsi flexion, 5/5 throughout. Forward flexion  to 90 degrees, extension to 15 degrees.   Neurological: He is alert and oriented to person, place, and time.  Reflex Scores:      Patellar reflexes are 2+ on the right side and 2+ on the left side. Skin: Skin is warm and dry.  Psychiatric: He has a normal mood and affect. His behavior is normal.  Nursing note and vitals reviewed.   Vitals:   10/23/16 1333  BP: 110/68  Pulse: 68  Resp: 18  Temp: 98.2 F (36.8 C)  TempSrc: Oral  SpO2: 98%  Weight: 288 lb (130.6 kg)  Height: 6' (1.829 m)   Assessment & Plan:   1. Strain of lumbar paraspinal muscle, subsequent encounter   2. Acute bilateral low back pain, with sciatica presence unspecified   3. Back spasm   Cont light duty at work.  Start PT and recheck in 2 wks  Meds ordered this encounter  Medications  . traMADol (ULTRAM) 50 MG tablet    Sig: Take 1-2 tablets (50-100 mg total) by mouth at bedtime as needed.    Dispense:  30 tablet    Refill:  0    Orders Placed This Encounter  Procedures  . Ambulatory referral to Physical Therapy    Referral Priority:   Routine    Referral Type:   Physical Medicine    Referral Reason:   Specialty Services Required    Requested Specialty:   Physical Therapy    Number of Visits Requested:  1    I personally performed the services described in this documentation, which was scribed in my presence. The recorded information has been reviewed and considered, and addended by me as needed.   Norberto SorensonEva Shaw, M.D.  [facility]  [place] [address] [place], [place] [address] [phone] phone [phone] fax  10/30/16 12:00 AM

## 2016-11-04 ENCOUNTER — Ambulatory Visit: Payer: Worker's Compensation | Attending: Family Medicine

## 2016-11-04 DIAGNOSIS — M545 Low back pain, unspecified: Secondary | ICD-10-CM

## 2016-11-04 DIAGNOSIS — M2560 Stiffness of unspecified joint, not elsewhere classified: Secondary | ICD-10-CM | POA: Diagnosis present

## 2016-11-04 DIAGNOSIS — M62830 Muscle spasm of back: Secondary | ICD-10-CM | POA: Diagnosis present

## 2016-11-04 DIAGNOSIS — M6281 Muscle weakness (generalized): Secondary | ICD-10-CM | POA: Diagnosis present

## 2016-11-04 DIAGNOSIS — M25651 Stiffness of right hip, not elsewhere classified: Secondary | ICD-10-CM | POA: Diagnosis present

## 2016-11-04 DIAGNOSIS — M25652 Stiffness of left hip, not elsewhere classified: Secondary | ICD-10-CM | POA: Insufficient documentation

## 2016-11-04 NOTE — Therapy (Signed)
[facility] [address] [place], [place], [address] Phone: [phone]   Fax:  [phone]  Physical Therapy Evaluation  Patient Details  Name: Fred Berg MRN: [id number] Date of Birth: 06-30-1971 Referring Provider: Norberto Sorenson  MD  Encounter Date: 11/04/2016      PT End of Session - 11/04/16 1513    Visit Number 1   Number of Visits 12   Date for PT Re-Evaluation 12/20/16   PT Start Time 0215   PT Stop Time 0300   PT Time Calculation (min) 45 min   Activity Tolerance Patient tolerated treatment well;No increased pain   Behavior During Therapy [facility] for tasks assessed/performed      No past medical history on file.  No past surgical history on file.  There were no vitals filed for this visit.       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  Pain has eased now but feels it is slow. Using heat and cold.    Goes to gym 2x/week.    Limitations Standing;House hold activities  bending, Sitting more at work, less lifting Elba Barman.  Limiting lifting of child, bending at home   How long can you sit comfortably? 15 min   How long can you stand comfortably? 2 min   How long can you walk comfortably? 5 min   Diagnostic tests Xray:   negative   Patient Stated Goals He wants to retrain muscles.      Currently in Pain? Yes   Pain Score 7    Pain Location Back   Pain Orientation Posterior;Left;Right   Pain Descriptors / Indicators Tightness;Aching   Pain Type Acute pain   Pain Radiating Towards knee pain worse since back onset but had pain in Lt kne proior to injury   Pain Onset 1 to 4 weeks ago   Pain Frequency Constant   Aggravating Factors  see above   Pain Relieving Factors ice,Meds help some            [facility] PT Assessment - 11/04/16 0001      Assessment   Medical Diagnosis acute lumbar strain   Referring Provider Norberto Sorenson  MD   Onset Date/Surgical Date 10/16/16   Next MD Visit 11/05/16   Prior Therapy no     Precautions   Precautions None     Restrictions   Weight Bearing Restrictions No     Balance Screen   Has the patient fallen in the past 6 months No   Has the patient had a decrease in activity level because of a fear of falling?  Yes  due to injury   Is the patient reluctant to leave their home because of a fear of falling?  No     Prior Function   Level of Independence Needs assistance with homemaking     Cognition   Overall Cognitive Status Within Functional Limits for tasks assessed     Posture/Postural Control   Posture Comments WNL      ROM / Strength   AROM / PROM / Strength AROM;Strength     AROM   AROM Assessment Site Lumbar   Lumbar Flexion 45   Lumbar Extension 12   Lumbar - Right Side Bend 20   Lumbar - Left Side Bend 15     Strength   Overall Strength Comments Normal LE strength  fair abdominals  Flexibility   Soft Tissue Assessment /Muscle Length yes   Hamstrings 55 degrees bilat also decreased range with fig 4 and adduction RT and LT      Ambulation/Gait   Gait Comments WNL                   [facility] Adult PT Treatment/Exercise - 11/04/16 0001      Manual Therapy   Manual Therapy Joint mobilization;Manual Traction   Joint Mobilization anterior hip mobs Gr 3-4 With ER prone  t   Manual Traction long axis To LT leg GR 4      HEP see below           PT Education - 11/04/16 1512    Education provided Yes   Education Details POC , results of eval , HEP   Person(s) Educated Patient   Methods Explanation;Tactile cues;Verbal cues;Handout   Comprehension Returned demonstration;Verbalized understanding          PT Short Term Goals - 11/04/16 1504      PT SHORT TERM GOAL #1   Title Independent with intial HEP   Time 3   Period Weeks   Status New     PT SHORT TERM GOAL #2   Title He will report pain decreased 40% or more with normal  activity   Time 3   Period Weeks   Status New     PT SHORT TERM GOAL #3   Title He will be able to walk for 20 min or more with min pain   Time 3   Period Weeks   Status New     PT SHORT TERM GOAL #4   Title He will be able to stand for 10 min or more with min pain   Time 3   Period Weeks   Status New           PT Long Term Goals - 11/04/16 1505      PT LONG TERM GOAL #1   Title He will be independent with all HEP issued   Time 6   Period Weeks   Status New     PT LONG TERM GOAL #2   Title He will return to lifting at work modified load   Time 6   Period Weeks   Status New     PT LONG TERM GOAL #3   Title He will report able to lift child with 1-2 max pain   Time 6   Status New     PT LONG TERM GOAL #4   Title He will demo understanding of good posture sitting and with lifting   Time 6   Period Weeks   Status New     PT LONG TERM GOAL #5   Title He will be able to be on feet and walk at work from building to building   Time 6   Period Weeks   Status New     Additional Long Term Goals   Additional Long Term Goals Yes     PT LONG TERM GOAL #6   Title With proper support he sill be able to sit for 60 min for lork or leisure with 1-2 max pain   Time 6   Period Weeks   Status New               Plan - 11/04/16 1500    Clinical Impression Statement Fred Berg presents for moderate complexity eval with complaint of LT>RT LBP, spasms,  stiffness in spine and hips  , core weakness limiting activity at home and work. He reports feeling looser post session   Rehab Potential Good   PT Frequency 2x / week   PT Duration 6 weeks   PT Treatment/Interventions Electrical Stimulation;Iontophoresis 4mg /ml Dexamethasone;Moist Heat;Ultrasound;Traction;Passive range of motion;Patient/family education;Taping;Manual techniques;Therapeutic exercise;Dry needling   PT Next Visit Plan Core stability exercise, manual,    PT Home Exercise Plan Modalities , manual review  stretching , core strength for HEP   Consulted and Agree with Plan of Care Patient      Patient will benefit from skilled therapeutic intervention in order to improve the following deficits and impairments:  Decreased activity tolerance, Pain, Decreased range of motion, Increased muscle spasms, Decreased strength  Visit Diagnosis: Acute bilateral low back pain without sciatica - Plan: PT plan of care cert/re-cert  Stiffness of right hip, not elsewhere classified - Plan: PT plan of care cert/re-cert  Stiffness of left hip, not elsewhere classified - Plan: PT plan of care cert/re-cert  Joint stiffness of spine - Plan: PT plan of care cert/re-cert  Muscle spasm of back - Plan: PT plan of care cert/re-cert  Muscle weakness (generalized) - Plan: PT plan of care cert/re-cert     Problem List There are no active problems to display for this patient.   Caprice RedChasse, Athenia Rys M  PT 11/04/2016, 3:15 PM  [facility] [address] [place], [place], [address] Phone: [phone]   Fax:  [phone]  Name: Fred Berg MRN: [id number] Date of Birth: 06/20/1971

## 2016-11-04 NOTE — Patient Instructions (Signed)
From cabinet issued basic williams exercises , cat/camel, quadratus stretching   2-3x/day 2-3 reps 10-20 sec stretching.

## 2016-11-05 ENCOUNTER — Encounter: Payer: Self-pay | Admitting: Family Medicine

## 2016-11-05 ENCOUNTER — Ambulatory Visit (INDEPENDENT_AMBULATORY_CARE_PROVIDER_SITE_OTHER): Payer: Worker's Compensation | Admitting: Family Medicine

## 2016-11-05 VITALS — BP 126/82 | HR 77 | Temp 98.7°F | Resp 18 | Ht 72.0 in | Wt 287.0 lb

## 2016-11-05 DIAGNOSIS — S39012D Strain of muscle, fascia and tendon of lower back, subsequent encounter: Secondary | ICD-10-CM | POA: Diagnosis not present

## 2016-11-05 NOTE — Patient Instructions (Addendum)
   IF you received an x-ray today, you will receive an invoice from [facility]. Please contact [facility] at [phone] with questions or concerns regarding your invoice.   IF you received labwork today, you will receive an invoice from [facility]. Please contact [facility] at [phone] with questions or concerns regarding your invoice.   Our billing staff will not be able to assist you with questions regarding bills from these companies.  You will be contacted with the lab results as soon as they are available. The fastest way to get your results is to activate your My Chart account. Instructions are located on the last page of this paperwork. If you have not heard from us regarding the results in 2 weeks, please contact this office.     Low Back Strain A strain is a stretch or tear in a muscle or the strong cords of tissue that attach muscle to bone (tendons). Strains of the lower back (lumbar spine) are a common cause of low back pain. A strain occurs when muscles or tendons are torn or are stretched beyond their limits. The muscles may become inflamed, resulting in pain and sudden muscle tightening (spasms). A strain can happen suddenly due to an injury (trauma), or it can develop gradually due to overuse. There are three types of strains:  Grade 1 is a mild strain involving a minor tear of the muscle fibers or tendons. This may cause some pain but no loss of muscle strength.  Grade 2 is a moderate strain involving a partial tear of the muscle fibers or tendons. This causes more severe pain and some loss of muscle strength.  Grade 3 is a severe strain involving a complete tear of the muscle or tendon. This causes severe pain and complete or nearly complete loss of muscle strength. What are the causes? This condition may be caused by:  Trauma, such as a fall or a hit to the body.  Twisting or overstretching the back. This may result  from doing activities that require a lot of energy, such as lifting heavy objects. What increases the risk? The following factors may increase your risk of getting this condition:  Playing contact sports.  Participating in sports or activities that put excessive stress on the back and require a lot of bending and twisting, including:  Lifting weights or heavy objects.  Gymnastics.  Soccer.  Figure skating.  Snowboarding.  Being overweight or obese.  Having poor strength and flexibility. What are the signs or symptoms? Symptoms of this condition may include:  Sharp or dull pain in the lower back that does not go away. Pain may extend to the buttocks.  Stiffness.  Limited range of motion.  Inability to stand up straight due to stiffness or pain.  Muscle spasms. How is this diagnosed?   This condition may be diagnosed based on:  Your symptoms.  Your medical history.  A physical exam.  Your health care provider may push on certain areas of your back to determine the source of your pain.  You may be asked to bend forward, backward, and side to side to assess the severity of your pain and your range of motion.  Imaging tests, such as:  X-rays.  MRI. How is this treated? Treatment for this condition may include:  Applying heat and cold to the affected area.  Medicines to help relieve pain and to relax your muscles (muscle relaxants).  NSAIDs to help reduce swelling and discomfort.    Physical therapy. When your symptoms improve, it is important to gradually return to your normal routine as soon as possible to reduce pain, avoid stiffness, and avoid loss of muscle strength. Generally, symptoms should improve within 6 weeks of treatment. However, recovery time varies. Follow these instructions at home: Managing pain, stiffness, and swelling  If directed, apply ice to the injured area during the first 24 hours after your injury.  Put ice in a plastic  bag.  Place a towel between your skin and the bag.  Leave the ice on for 20 minutes, 2-3 times a day.  If directed, apply heat to the affected area as often as told by your health care provider. Use the heat source that your health care provider recommends, such as a moist heat pack or a heating pad.  Place a towel between your skin and the heat source.  Leave the heat on for 20-30 minutes.  Remove the heat if your skin turns bright red. This is especially important if you are unable to feel pain, heat, or cold. You may have a greater risk of getting burned. Activity  Rest and return to your normal activities as told by your health care provider. Ask your health care provider what activities are safe for you.  Avoid activities that take a lot of effort (are strenuous) for as long as told by your health care provider.  Do exercises as told by your health care provider. General instructions  Take over-the-counter and prescription medicines only as told by your health care provider.  If you have questions or concerns about safety while taking pain medicine, talk with your health care provider.  Do not drive or operate heavy machinery until you know how your pain medicine affects you.  Do not use any tobacco products, such as cigarettes, chewing tobacco, and e-cigarettes. Tobacco can delay bone healing. If you need help quitting, ask your health care provider.  Keep all follow-up visits as told by your health care provider. This is important. How is this prevented?  Warm up and stretch before being active.  Cool down and stretch after being active.  Give your body time to rest between periods of activity.  Avoid:  Being physically inactive for long periods at a time.  Exercising or playing sports when you are tired or in pain.  Use correct form when playing sports and lifting heavy objects.  Use good posture when sitting and standing.  Maintain a healthy weight.  Sleep  on a mattress with medium firmness to support your back.  Make sure to use equipment that fits you, including shoes that fit well.  Be safe and responsible while being active to avoid falls.  Do at least 150 minutes of moderate-intensity exercise each week, such as brisk walking or water aerobics. Try a form of exercise that takes stress off your back, such as swimming or stationary cycling.  Maintain physical fitness, including:  Strength.  Flexibility.  Cardiovascular fitness.  Endurance. Contact a health care provider if:  Your back pain does not improve after 6 weeks of treatment.  Your symptoms get worse. Get help right away if:  Your back pain is severe.  You are unable to stand or walk.  You develop pain in your legs.  You develop weakness in your buttocks or legs.  You have difficulty controlling when you urinate or when you have a bowel movement. This information is not intended to replace advice given to you by your health care provider.   Make sure you discuss any questions you have with your health care provider. Document Released: 12/02/2005 Document Revised: 08/08/2016 Document Reviewed: 09/13/2015 Elsevier Interactive Patient Education  2017 [employer] Inc[employer]  

## 2016-11-05 NOTE — Progress Notes (Signed)
Subjective:  By signing my name below, I, Fred Berg, attest that this documentation has been prepared under the direction and in the presence of Norberto SorensonEva Mylinh Cragg, MD Electronically Signed: Charline BillsEssence Berg, ED Scribe 11/05/2016 at 8:22 AM.   Patient ID: Fred Berg, male    DOB: 09/06/1971, 45 y.o.   MRN: [id number] Chief Complaint  Patient presents with  . Follow-up    WORKERS COMP ON LOWER BACK   HPI HPI Comments: Fred BankKevin A Berg is a 45 y.o. male who presents to the [facility] for a worker's comp reevaluation. Pt's 4th visit here for back injury that occurred on 10/16/16 when he was lifting a 50 lb box. He's been using ibuprofen and Flexeril in addition to Tramadol. Has been on light duty at work. Was referred to PT at last visit 2 weeks prior. He had his initial PT visit yesterday. Today, pt states that pain medication, stretching and ice has allowed him to tolerate back pain. He is currently taking ibuprofen x2 daily. Pt states back pain radiates into his right buttock but not in his lower extremities. He states that PT recommended dry needling and he is currently seeing PT x2 weekly.   Review of Systems  Constitutional: Positive for activity change. Negative for chills and fever.  Gastrointestinal: Negative for abdominal pain, nausea and vomiting.  Genitourinary: Negative for decreased urine volume, difficulty urinating, frequency and urgency.  Musculoskeletal: Positive for back pain, gait problem and myalgias.  Skin: Negative for color change and wound.  Neurological: Positive for weakness and numbness.  Hematological: Does not bruise/bleed easily.  Psychiatric/Behavioral: Positive for sleep disturbance.  BP 126/82 (BP Location: Right Arm, Patient Position: Sitting, Cuff Size: Large)   Pulse 77   Temp 98.7 F (37.1 C) (Oral)   Resp 18   Ht 6' (1.829 m)   Wt 287 lb (130.2 kg)   SpO2 (!) 7%   PF 97 L/min   BMI 38.92 kg/m     Objective:   Physical Exam    Constitutional: He is oriented to person, place, and time. He appears well-developed and well-nourished. No distress.  HENT:  Head: Normocephalic and atraumatic.  Eyes: Conjunctivae and EOM are normal.  Neck: Neck supple. No tracheal deviation present.  Cardiovascular: Normal rate.   Pulmonary/Chest: Effort normal. No respiratory distress.  Musculoskeletal: Normal range of motion.  LE strength 5/5. Excellent ROM in forward flexion of the spine and excellent extension. Excellent lateral rotation. NO Tenderness to palpation over the lumbar spinous process over paraspinal muscles.   Neurological: He is alert and oriented to person, place, and time.  Reflex Scores:      Patellar reflexes are 1+ on the right side. Trace L patellar DTR. Unable to illicit the achilles   Skin: Skin is warm and dry.  Psychiatric: He has a normal mood and affect. His behavior is normal.  Nursing note and vitals reviewed.     Assessment & Plan:   1. Strain of lumbar paraspinal muscle, subsequent encounter   Gradually healing, expect will take another 4-6 wks until he is back at 100%. Cont PT with prn ibuprofen 1-2x/d, flexeril qhs and tramadol for breakThrough pain. Continue light duty at work. Recheck in 3 weeks.   I personally performed the services described in this documentation, which was scribed in my presence. The recorded information has been reviewed and considered, and addended by me as needed.   Norberto SorensonEva Casimer Russett, M.D.  [facility]  [facility] [address] [place], [place] [address] [phone] phone [phone] fax  11/05/16 10:04 AM

## 2016-11-13 ENCOUNTER — Ambulatory Visit: Payer: Worker's Compensation | Attending: Family Medicine

## 2016-11-13 DIAGNOSIS — M545 Low back pain, unspecified: Secondary | ICD-10-CM

## 2016-11-13 DIAGNOSIS — M2560 Stiffness of unspecified joint, not elsewhere classified: Secondary | ICD-10-CM | POA: Insufficient documentation

## 2016-11-13 DIAGNOSIS — M25651 Stiffness of right hip, not elsewhere classified: Secondary | ICD-10-CM | POA: Insufficient documentation

## 2016-11-13 DIAGNOSIS — M6281 Muscle weakness (generalized): Secondary | ICD-10-CM | POA: Diagnosis present

## 2016-11-13 DIAGNOSIS — M25652 Stiffness of left hip, not elsewhere classified: Secondary | ICD-10-CM | POA: Diagnosis present

## 2016-11-13 DIAGNOSIS — M62830 Muscle spasm of back: Secondary | ICD-10-CM | POA: Diagnosis present

## 2016-11-13 NOTE — Therapy (Signed)
[facility] [address] [place], [place], [address] Phone: [phone]   Fax:  [phone]  Physical Therapy Treatment  Patient Details  Name: Fred Berg: [id number] Date of Birth: 09/28/1971 Referring Provider: Norberto SorensonEva Shaw  MD  Encounter Date: 11/13/2016      PT End of Session - 11/13/16 0943    Visit Number 2   Number of Visits 12   Date for PT Re-Evaluation 12/20/16   PT Start Time 0845   PT Stop Time 0930   PT Time Calculation (min) 45 min   Activity Tolerance Patient tolerated treatment well   Behavior During Therapy [facility] for tasks assessed/performed      No past medical history on file.  No past surgical history on file.  There were no vitals filed for this visit.      Subjective Assessment - 11/13/16 0852    Subjective Feel less stiff but legs still feel weak especially on stairs                         [facility] Treatment/Exercise - 11/13/16 0853      Exercises   Exercises Lumbar     Lumbar Exercises: Stretches   Passive Hamstring Stretch 2 reps;30 seconds   Passive Hamstring Stretch Limitations with strap   Lower Trunk Rotation 2 reps;10 seconds   Pelvic Tilt --  10 reps 5 sec   Piriformis Stretch 2 reps;30 seconds  RT/LT     Lumbar Exercises: Aerobic   Stationary Bike Nustep :L 5 6 min UE and LE     Lumbar Exercises: Quadruped   Madcat/Old Horse 15 reps   Other Quadruped Lumbar Exercises childs pose x 3 20 sec  (he is very stiff)     Manual Therapy   Joint Mobilization anterior hip mobs Gr 3-4 With ER prone  Rt and LT  and IR stretching and posterio mobs to each hip grade4  ppsterior glide and PA glides to lumbar spine GR 4 2 bouts 45-50 reps   Manual Traction long axis To LT/RT  leg GR 4                   PT Short Term Goals - 11/04/16 1504      PT SHORT TERM GOAL #1   Title Independent with intial HEP   Time 3   Period Weeks   Status New     PT  SHORT TERM GOAL #2   Title He will report pain decreased 40% or more with normal activity   Time 3   Period Weeks   Status New     PT SHORT TERM GOAL #3   Title He will be able to walk for 20 min or more with min pain   Time 3   Period Weeks   Status New     PT SHORT TERM GOAL #4   Title He will be able to stand for 10 min or more with min pain   Time 3   Period Weeks   Status New           PT Long Term Goals - 11/04/16 1505      PT LONG TERM GOAL #1   Title He will be independent with all HEP issued   Time 6   Period Weeks   Status New     PT LONG TERM GOAL #2   Title He will return to  lifting at work modified load   Time 6   Period Weeks   Status New     PT LONG TERM GOAL #3   Title He will report able to lift child with 1-2 max pain   Time 6   Status New     PT LONG TERM GOAL #4   Title He will demo understanding of good posture sitting and with lifting   Time 6   Period Weeks   Status New     PT LONG TERM GOAL #5   Title He will be able to be on feet and walk at work from building to building   Time 6   Period Weeks   Status New     Additional Long Term Goals   Additional Long Term Goals Yes     PT LONG TERM GOAL #6   Title With proper support he sill be able to sit for 60 min for lork or leisure with 1-2 max pain   Time 6   Period Weeks   Status New               Plan - 11/13/16 0859    Clinical Impression Statement He reported feeling better after the session looser with minimal pain. Need to add some stabilization exercises for home.    PT Treatment/Interventions Electrical Stimulation;Iontophoresis 4mg /ml Dexamethasone;Moist Heat;Ultrasound;Traction;Passive range of motion;Patient/family education;Taping;Manual techniques;Therapeutic exercise;Dry needling   PT Next Visit Plan Core stability exercise for home, manual,    PT Home Exercise Plan  stretching ,    Consulted and Agree with Plan of Care Patient      Patient will benefit  from skilled therapeutic intervention in order to improve the following deficits and impairments:  Decreased activity tolerance, Pain, Decreased range of motion, Increased muscle spasms, Decreased strength  Visit Diagnosis: Acute bilateral low back pain without sciatica  Stiffness of right hip, not elsewhere classified  Stiffness of left hip, not elsewhere classified  Joint stiffness of spine  Muscle spasm of back  Muscle weakness (generalized)     Problem List There are no active problems to display for this patient.   Caprice RedChasse, Layla Gramm M  PT 11/13/2016, 9:47 AM  [facility] [address] [place], [place], [address] Phone: [phone]   Fax:  [phone]  Name: Fred Berg: [id number] Date of Birth: 10/22/1971

## 2016-11-15 ENCOUNTER — Ambulatory Visit: Payer: Worker's Compensation | Attending: Family Medicine

## 2016-11-15 DIAGNOSIS — M545 Low back pain, unspecified: Secondary | ICD-10-CM

## 2016-11-15 DIAGNOSIS — M2560 Stiffness of unspecified joint, not elsewhere classified: Secondary | ICD-10-CM | POA: Diagnosis present

## 2016-11-15 DIAGNOSIS — M25651 Stiffness of right hip, not elsewhere classified: Secondary | ICD-10-CM | POA: Diagnosis present

## 2016-11-15 DIAGNOSIS — M62830 Muscle spasm of back: Secondary | ICD-10-CM

## 2016-11-15 DIAGNOSIS — M25652 Stiffness of left hip, not elsewhere classified: Secondary | ICD-10-CM | POA: Diagnosis present

## 2016-11-15 DIAGNOSIS — M6281 Muscle weakness (generalized): Secondary | ICD-10-CM | POA: Diagnosis present

## 2016-11-15 NOTE — Therapy (Signed)
[name], [name], [address] Phone: [phone]   Fax:  [phone]  Physical Therapy Treatment  Patient Details  Name: Fred Berg MRN: [id number] Date of Birth: 01-Jun-1971 Referring Provider: Delman Cheadle  MD  Encounter Date: 11/15/2016      PT End of Session - 11/15/16 0749    Visit Number 3   Number of Visits 12   Date for PT Re-Evaluation 12/20/16   PT Start Time 0747   PT Stop Time 0836   PT Time Calculation (min) 49 min   Activity Tolerance Patient tolerated treatment well   Behavior During Therapy [facility] for tasks assessed/performed      No past medical history on file.  No past surgical history on file.  There were no vitals filed for this visit.      Subjective Assessment - 11/15/16 0749    Subjective More stiffness today rather than pain   Currently in Pain? Yes   Pain Score 2    Pain Location Back   Pain Orientation Right;Left;Posterior   Pain Descriptors / Indicators Aching;Tightness   Pain Type --  sub acute   Pain Onset 1 to 4 weeks ago   Pain Frequency Constant   Aggravating Factors  standing heavier activity   Pain Relieving Factors ice meds stretching   Effect of Pain on Daily Activities still limiting lifting at work   Multiple Pain Sites No                         [facility] Adult PT Treatment/Exercise - 11/15/16 0001      Lumbar Exercises: Aerobic   Stationary Bike Nustep :L 5 6 min UE and LE     Lumbar Exercises: Supine   Bridge Limitations shoulder bridge with emphasis on segmental motion   Other Supine Lumbar Exercises Hip stretches butterfly, fig 4 with flexion and push down  for IR and ER,   piriformis  and adductor stretch x/day 30-60 sec 2-3 reps RT and Lt leg     Manual Therapy   Joint Mobilization anterior hip mobs Gr 3-4 With ER prone  Rt and LT  and IR stretching and posterior mobs to each hip grade4  posterior glide and PA glides to lumbar  spine GR 4 2 bouts 45-50 reps                PT Education - 11/15/16 0838    Education provided Yes   Education Details misc hip stretching sheet  and shoulder bridge   Person(s) Educated Patient   Methods Explanation;Demonstration;Tactile cues;Verbal cues;Handout   Comprehension Returned demonstration;Verbalized understanding          PT Short Term Goals - 11/15/16 0840      PT SHORT TERM GOAL #1   Title Independent with intial HEP   Status Achieved     PT SHORT TERM GOAL #2   Title He will report pain decreased 40% or more with normal activity   Baseline varies but better   Status Partially Met           PT Long Term Goals - 11/04/16 1505      PT LONG TERM GOAL #1   Title He will be independent with all HEP issued   Time 6   Period Weeks   Status New     PT LONG TERM GOAL #2   Title He will return to lifting at work modified  load   Time 6   Period Weeks   Status New     PT LONG TERM GOAL #3   Title He will report able to lift child with 1-2 max pain   Time 6   Status New     PT LONG TERM GOAL #4   Title He will demo understanding of good posture sitting and with lifting   Time 6   Period Weeks   Status New     PT LONG TERM GOAL #5   Title He will be able to be on feet and walk at work from building to building   Time 6   Period Weeks   Status New     Additional Long Term Goals   Additional Long Term Goals Yes     PT LONG TERM GOAL #6   Title With proper support he sill be able to sit for 60 min for lork or leisure with 1-2 max pain   Time 6   Period Weeks   Status New               Plan - 11/15/16 0839    Clinical Impression Statement Pt reports looser post session and agreed to do the new stretches and bridge exercise.   PT Treatment/Interventions Electrical Stimulation;Iontophoresis 59m/ml Dexamethasone;Moist Heat;Ultrasound;Traction;Passive range of motion;Patient/family education;Taping;Manual techniques;Therapeutic  exercise;Dry needling   PT Next Visit Plan Core stability exercise for home, manual,  Goal assessment, lifting mechanics   PT Home Exercise Plan  misc hip stretches from sheet and shoulder bridge   Consulted and Agree with Plan of Care Patient      Patient will benefit from skilled therapeutic intervention in order to improve the following deficits and impairments:  Decreased activity tolerance, Pain, Decreased range of motion, Increased muscle spasms, Decreased strength  Visit Diagnosis: Acute bilateral low back pain without sciatica  Stiffness of right hip, not elsewhere classified  Stiffness of left hip, not elsewhere classified  Muscle spasm of back  Joint stiffness of spine  Muscle weakness (generalized)     Problem List There are no active problems to display for this patient.   CDarrel HooverPT 11/15/2016, 8:42 AM  [facility] [address]Phone: [phone]  Fax:  [phone] Name: Fred SHACKLETONMRN: [id number] of Birth: 308-21-1972

## 2016-11-15 NOTE — Patient Instructions (Signed)
Misc. hip stretches from sheet from cabinet 2x/day 30-60 sec x 2 reps RT and LT supine and standing  As able

## 2016-11-20 ENCOUNTER — Ambulatory Visit: Payer: Worker's Compensation

## 2016-11-20 DIAGNOSIS — M545 Low back pain, unspecified: Secondary | ICD-10-CM

## 2016-11-20 DIAGNOSIS — M25651 Stiffness of right hip, not elsewhere classified: Secondary | ICD-10-CM

## 2016-11-20 DIAGNOSIS — M25652 Stiffness of left hip, not elsewhere classified: Secondary | ICD-10-CM

## 2016-11-20 DIAGNOSIS — M62830 Muscle spasm of back: Secondary | ICD-10-CM

## 2016-11-20 DIAGNOSIS — M2560 Stiffness of unspecified joint, not elsewhere classified: Secondary | ICD-10-CM

## 2016-11-20 NOTE — Therapy (Signed)
[facility] [address] [place], [place], [address] Phone: [phone]   Fax:  [phone]  Physical Therapy Treatment  Patient Details  Name: Fred Berg MRN: [id number] Date of Birth: [birth year] Referring Provider: Norberto Sorenson  MD  Encounter Date: 11/20/2016      PT End of Session - 11/20/16 0752    Visit Number 4   Number of Visits 12   Date for PT Re-Evaluation 12/20/16   PT Start Time 0753   PT Stop Time 0840   PT Time Calculation (min) 47 min   Activity Tolerance Patient tolerated treatment well   Behavior During Therapy [facility] for tasks assessed/performed      No past medical history on file.  No past surgical history on file.  There were no vitals filed for this visit.      Subjective Assessment - 11/20/16 0753    Subjective Stiff . Stretched this AM to get going. Initially pain was more on LT but now its on RT.   Currently in Pain? --  REports more stiffness than pain                         [facility] Adult PT Treatment/Exercise - 11/20/16 0801      Therapeutic Activites    Therapeutic Activities Lifting   Lifting box with handles from 8 inches off ground he demo good lifting technique lifts x1 8 pounds/18 pounds/28 pounds/ 33 pounds all with good technique and last 2 weight s lifted 5 reps each.  He reported muscle strain but no pain with the last 2 weights.      Lumbar Exercises: Stretches   Passive Hamstring Stretch 1 rep;60 seconds  RT/LT   Single Knee to Chest Stretch 1 rep;60 seconds  RT /LT   Pelvic Tilt --  15 reps 3 sec  cued to do smoothly   Piriformis Stretch 1 rep;60 seconds  RT/LT     Lumbar Exercises: Aerobic   Stationary Bike Nustep :L 5 6 min UE and LE     Lumbar Exercises: Quadruped   Madcat/Old Horse 10 reps   Opposite Arm/Leg Raise 15 reps;Right arm/Left leg;Left arm/Right leg;3 seconds   Other Quadruped Lumbar Exercises childs pose x 3 30 sec       Manual Therapy    Joint Mobilization anterior hip mobs Gr 3-4 With ER prone  Rt and LT  and IR stretching and posterior mobs to each hip grade4  posterior glide and PA glides to lumbar spine GR 4 2 bouts 45-50 reps   Manual Traction long axis To LT/RT  leg GR 4                 PT Education - 11/20/16 0839    Education provided Yes   Education Details lifting technique   Person(s) Educated Patient   Methods Explanation;Demonstration   Comprehension Returned demonstration;Verbalized understanding          PT Short Term Goals - 11/20/16 0802      PT SHORT TERM GOAL #1   Title Independent with intial HEP   Status Achieved     PT SHORT TERM GOAL #2   Title He will report pain decreased 40% or more with normal activity   Baseline varies but better   Status Partially Met     PT SHORT TERM GOAL #3   Title He will be able to walk for 20 min or more with min  pain   Baseline 10 min   Status On-going     PT SHORT TERM GOAL #4   Title He will be able to stand for 10 min or more with min pain   Baseline 5 Min   Status On-going           PT Long Term Goals - 11/04/16 1505      PT LONG TERM GOAL #1   Title He will be independent with all HEP issued   Time 6   Period Weeks   Status New     PT LONG TERM GOAL #2   Title He will return to lifting at work modified load   Time 6   Period Weeks   Status New     PT LONG TERM GOAL #3   Title He will report able to lift child with 1-2 max pain   Time 6   Status New     PT LONG TERM GOAL #4   Title He will demo understanding of good posture sitting and with lifting   Time 6   Period Weeks   Status New     PT LONG TERM GOAL #5   Title He will be able to be on feet and walk at work from building to building   Time 6   Period Weeks   Status New     Additional Long Term Goals   Additional Long Term Goals Yes     PT LONG TERM GOAL #6   Title With proper support he sill be able to sit for 60 min for lork or leisure with 1-2 max  pain   Time 6   Period Weeks   Status New               Plan - 11/20/16 0753    Clinical Impression Statement Good lifting technique with load off floor  and tolerated 33 pounds but may be safer with 25 pounds max if repetative lifting.     PT Treatment/Interventions Electrical Stimulation;Iontophoresis 25m/ml Dexamethasone;Moist Heat;Ultrasound;Traction;Passive range of motion;Patient/family education;Taping;Manual techniques;Therapeutic exercise;Dry needling   PT Next Visit Plan continue manual , core strength stretching , lifting  add quadraped exerc to HEP   PT Home Exercise Plan  misc hip stretches from sheet and shoulder bridge   Consulted and Agree with Plan of Care Patient      Patient will benefit from skilled therapeutic intervention in order to improve the following deficits and impairments:  Decreased activity tolerance, Pain, Decreased range of motion, Increased muscle spasms, Decreased strength  Visit Diagnosis: Acute bilateral low back pain without sciatica  Stiffness of right hip, not elsewhere classified  Stiffness of left hip, not elsewhere classified  Muscle spasm of back  Joint stiffness of spine     Problem List There are no active problems to display for this patient.   CDarrel Hoover12/05/2016, 8:43 AM  [facility] [place] [address]Phone: [phone]  Fax:  [phone] Name: Fred GATESMRN: [id number] of Birth: [birth year]

## 2016-11-22 ENCOUNTER — Ambulatory Visit: Payer: Worker's Compensation | Admitting: Physical Therapy

## 2016-11-22 DIAGNOSIS — M25651 Stiffness of right hip, not elsewhere classified: Secondary | ICD-10-CM

## 2016-11-22 DIAGNOSIS — M545 Low back pain, unspecified: Secondary | ICD-10-CM

## 2016-11-22 DIAGNOSIS — M25652 Stiffness of left hip, not elsewhere classified: Secondary | ICD-10-CM

## 2016-11-22 DIAGNOSIS — M62830 Muscle spasm of back: Secondary | ICD-10-CM

## 2016-11-22 DIAGNOSIS — M6281 Muscle weakness (generalized): Secondary | ICD-10-CM

## 2016-11-22 DIAGNOSIS — M2560 Stiffness of unspecified joint, not elsewhere classified: Secondary | ICD-10-CM

## 2016-11-22 NOTE — Therapy (Signed)
[place], [name], [address] Phone: [phone]   Fax:  [phone]  Physical Therapy Treatment  Patient Details  Name: GABRIELA GIANNELLI MRN: [id number] Date of Birth: February 19, 1971 Referring Provider: Delman Cheadle  MD  Encounter Date: 11/22/2016      PT End of Session - 11/22/16 0816    Visit Number 5   Number of Visits 12   Date for PT Re-Evaluation 12/20/16   PT Start Time 0804   PT Stop Time 6962   PT Time Calculation (min) 51 min   Activity Tolerance Patient tolerated treatment well   Behavior During Therapy [facility] for tasks assessed/performed      No past medical history on file.  No past surgical history on file.  There were no vitals filed for this visit.      Subjective Assessment - 11/22/16 0806    Subjective Used the heating pad already, R>L , stiffness.     Currently in Pain? Yes   Pain Score 5    Pain Location Back   Pain Orientation Right   Pain Descriptors / Indicators Aching   Pain Onset More than a month ago           [facility] Adult PT Treatment/Exercise - 11/22/16 0809      Lumbar Exercises: Stretches   Active Hamstring Stretch 2 reps;30 seconds     Lumbar Exercises: Standing   Functional Squats 10 reps;20 reps   Functional Squats Limitations 25 kettle bell lift from 4    Lifting From floor;10 reps   Other Standing Lumbar Exercises single leg hip hinge with min UE support x 10 each side    Other Standing Lumbar Exercises weighted 25 lbs walk each UE, easier with weight on L side      Lumbar Exercises: Supine   Bridge Limitations shoulder bridge with emphasis on segmental motion     Lumbar Exercises: Quadruped   Plank modified 3 x 10 sec    Other Quadruped Lumbar Exercises childs pose x 3 30 sec    added lateral motion for Rt. trunk elongation      Moist Heat Therapy   Number Minutes Moist Heat 10 Minutes   Moist Heat Location Lumbar Spine     Manual Therapy   Joint  Mobilization ER/IR stretching Rt. hip in prone             PT Short Term Goals - 11/20/16 0802      PT SHORT TERM GOAL #1   Title Independent with intial HEP   Status Achieved     PT SHORT TERM GOAL #2   Title He will report pain decreased 40% or more with normal activity   Baseline varies but better   Status Partially Met     PT SHORT TERM GOAL #3   Title He will be able to walk for 20 min or more with min pain   Baseline 10 min   Status On-going     PT SHORT TERM GOAL #4   Title He will be able to stand for 10 min or more with min pain   Baseline 5 Min   Status On-going           PT Long Term Goals - 11/22/16 9528      PT LONG TERM GOAL #1   Title He will be independent with all HEP issued   Status On-going     PT LONG TERM GOAL #2  Title He will return to lifting at work modified load   Status On-going     PT [place] #3   Title He will report able to lift child with 1-2 max pain   Status On-going     PT LONG TERM GOAL #4   Title He will demo understanding of good posture sitting and with lifting   Status On-going     PT LONG TERM GOAL #5   Title He will be able to be on feet and walk at work from building to [place] #6   Title With proper support he sill be able to sit for 60 min for work or leisure with 1-2 max pain   Status On-going               Plan - 11/22/16 0931    Clinical Impression Statement Pt with difficulty maintaining hip and spinal neutral with single leg activities.  Good form with squatting with decent hip flexibility.  Increase in Rt. hip pain with standing exercises.    PT Next Visit Plan continue manual , core strength stretching , lifting  add quadruped exercise to HEP   PT Home Exercise Plan  misc hip stretches from sheet and shoulder bridge   Consulted and Agree with Plan of Care Patient      Patient will benefit from skilled therapeutic intervention in order to  improve the following deficits and impairments:  Decreased activity tolerance, Pain, Decreased range of motion, Increased muscle spasms, Decreased strength  Visit Diagnosis: Acute bilateral low back pain without sciatica  Stiffness of right hip, not elsewhere classified  Stiffness of left hip, not elsewhere classified  Muscle spasm of back  Joint stiffness of spine  Muscle weakness (generalized)     Problem List There are no active problems to display for this patient.   PAA,JENNIFER 11/22/2016, 9:32 AM  [facility] [address] [place], [name], [address] Phone: [phone]   Fax:  [phone]  Name: BRAD LIEURANCE MRN: [id number] Date of Birth: 13-Feb-1971   Raeford Razor, PT 11/22/16 9:33 AM Phone: [phone] Fax: [phone]

## 2016-11-27 ENCOUNTER — Ambulatory Visit: Payer: Worker's Compensation | Admitting: Physical Therapy

## 2016-11-27 DIAGNOSIS — M545 Low back pain, unspecified: Secondary | ICD-10-CM

## 2016-11-27 DIAGNOSIS — M25652 Stiffness of left hip, not elsewhere classified: Secondary | ICD-10-CM

## 2016-11-27 DIAGNOSIS — M6281 Muscle weakness (generalized): Secondary | ICD-10-CM

## 2016-11-27 DIAGNOSIS — M2560 Stiffness of unspecified joint, not elsewhere classified: Secondary | ICD-10-CM

## 2016-11-27 DIAGNOSIS — M62830 Muscle spasm of back: Secondary | ICD-10-CM

## 2016-11-27 DIAGNOSIS — M25651 Stiffness of right hip, not elsewhere classified: Secondary | ICD-10-CM

## 2016-11-27 NOTE — Therapy (Signed)
[name], [name], [address] Phone: [phone]   Fax:  [phone]  Physical Therapy Treatment  Patient Details  Name: Fred Berg MRN: [id number] Date of Birth: 02/23/1971 Referring Provider: Delman Cheadle  MD  Encounter Date: 11/27/2016      PT End of Session - 11/27/16 0805    Visit Number 6   Number of Visits 12   Date for PT Re-Evaluation 12/20/16   PT Start Time 0802   PT Stop Time 0843   PT Time Calculation (min) 41 min      No past medical history on file.  No past surgical history on file.  There were no vitals filed for this visit.                       [facility] Adult PT Treatment/Exercise - 11/27/16 0001      Lumbar Exercises: Stretches   Active Hamstring Stretch 2 reps;30 seconds     Lumbar Exercises: Supine   Bent Knee Raise 20 reps   Bent Knee Raise Limitations Table top with alternating leg lowers   Bridge Limitations shoulder bridge with emphasis on segmental motion     Lumbar Exercises: Quadruped   Opposite Arm/Leg Raise 15 reps;Right arm/Left leg;Left arm/Right leg  10 seconds    Plank modified 3 x 15 sec , Quadruped fire hydrants x 10 each   Other Quadruped Lumbar Exercises childs pose x 3 30 sec    added lateral motion for Rt. trunk elongation      Knee/Hip Exercises: Stretches   Hip Flexor Stretch 60 seconds   Hip Flexor Stretch Limitations right     Knee/Hip Exercises: Supine   Bridges with Ball Squeeze 5 reps   Single Leg Bridge 5 reps                  PT Short Term Goals - 11/27/16 0810      PT SHORT TERM GOAL #1   Title Independent with intial HEP   Time 3   Period Weeks   Status Achieved     PT SHORT TERM GOAL #2   Title He will report pain decreased 40% or more with normal activity   Baseline varies but better   Time 3   Period Weeks   Status Partially Met     PT SHORT TERM GOAL #3   Title He will be able to walk for 20  min or more with min pain   Baseline 15 min   Time 3   Period Weeks   Status On-going     PT SHORT TERM GOAL #4   Title He will be able to stand for 10 min or more with min pain   Baseline 5 Min   Time 3   Period Weeks   Status On-going           PT Long Term Goals - 11/27/16 0813      PT LONG TERM GOAL #1   Title He will be independent with all HEP issued   Time 6   Period Weeks   Status On-going     PT LONG TERM GOAL #2   Title He will return to lifting at work modified load   Time 6   Status On-going     PT LONG TERM GOAL #3   Title He will report able to lift child with 1-2 max pain   Baseline Lifted 5  yr old daughter for first time with no increased pain.    Time 6   Status Partially Met     PT LONG TERM GOAL #4   Title He will demo understanding of good posture sitting and with lifting   Time 6   Period Weeks   Status On-going     PT LONG TERM GOAL #5   Title He will be able to be on feet and walk at work from building to building   Baseline Some, not as much   Time 6   Period Weeks   Status Partially Met     PT LONG TERM GOAL #6   Title With proper support he sill be able to sit for 60 min for work or leisure with 1-2 max pain   Baseline can sit for  1 hour and then gets up to stretch   Time 6   Period Weeks   Status Achieved               Plan - 11/27/16 0854    Clinical Impression Statement Pt reports he was sore in back after last visit and used an ice pack. He reports stiffness dominant in back at first rise, after sitting and at the end of the day lumbar area feels fatigues. He was handed a bag of ice and felt back pain yesterday. He can walk 15 minutes without increased pain and is walking between building at work about once per day. Standing is limited to 5 minutes. He was able to lift his 75 year year old daughter without icnreased pain. LTG# 3, #5 partially met.  Focused core strength with good tolerance. Fatigues quickly with modified  planks.    PT Next Visit Plan continue manual , core strength stretching , lifting  add quadruped exercise to HEP   PT Home Exercise Plan  misc hip stretches from sheet and shoulder bridge   Consulted and Agree with Plan of Care Patient      Patient will benefit from skilled therapeutic intervention in order to improve the following deficits and impairments:  Decreased activity tolerance, Pain, Decreased range of motion, Increased muscle spasms, Decreased strength  Visit Diagnosis: Acute bilateral low back pain without sciatica  Stiffness of right hip, not elsewhere classified  Stiffness of left hip, not elsewhere classified  Muscle spasm of back  Joint stiffness of spine  Muscle weakness (generalized)     Problem List There are no active problems to display for this patient.   Dorene Ar, [place] 11/27/2016, 9:01 AM  [place], [name], [address] Phone: [phone]   Fax:  [phone]  Name: Fred Berg MRN: [id number] Date of Birth: 12/02/1971

## 2016-11-29 ENCOUNTER — Ambulatory Visit: Payer: Worker's Compensation

## 2016-11-29 DIAGNOSIS — M62830 Muscle spasm of back: Secondary | ICD-10-CM

## 2016-11-29 DIAGNOSIS — M2560 Stiffness of unspecified joint, not elsewhere classified: Secondary | ICD-10-CM

## 2016-11-29 DIAGNOSIS — M545 Low back pain, unspecified: Secondary | ICD-10-CM

## 2016-11-29 DIAGNOSIS — M25651 Stiffness of right hip, not elsewhere classified: Secondary | ICD-10-CM

## 2016-11-29 DIAGNOSIS — M25652 Stiffness of left hip, not elsewhere classified: Secondary | ICD-10-CM

## 2016-11-29 NOTE — Therapy (Addendum)
[place], [name], [address] Phone: [phone]   Fax:  [phone]  Physical Therapy Treatment/Discharge  Patient Details  Name: Fred Berg MRN: [id number] Date of Birth: Jul 05, 1971 Referring Provider: Delman Cheadle  MD  Encounter Date: 11/29/2016      PT End of Session - 11/29/16 0802    Visit Number 7   Number of Visits 12   Date for PT Re-Evaluation 12/20/16   PT Start Time 0755   PT Stop Time 0840   PT Time Calculation (min) 45 min   Activity Tolerance Patient tolerated treatment well   Behavior During Therapy [facility] for tasks assessed/performed      No past medical history on file.  No past surgical history on file.  There were no vitals filed for this visit.      Subjective Assessment - 11/29/16 0801    Subjective Reports 5/10 pain stating he has high pain tolerance    Currently in Pain? Yes   Pain Score 5    Pain Location Back   Pain Orientation Right   Pain Descriptors / Indicators Aching   Pain Type --  sub acute   Pain Onset More than a month ago   Pain Frequency Constant   Aggravating Factors  stand ing . lifting    Pain Relieving Factors ice meds, stretching   Multiple Pain Sites No                         [facility] Adult PT Treatment/Exercise - 11/29/16 0001      Lumbar Exercises: Stretches   Passive Hamstring Stretch 1 rep;60 seconds  with strap   Single Knee to Chest Stretch 1 rep;60 seconds  RT/LT   Single Knee to Chest Stretch Limitations opposit leg extended   Lower Trunk Rotation 2 reps;30 seconds   Lower Trunk Rotation Limitations with knee crossed over knee     Lumbar Exercises: Aerobic   Stationary Bike Nustep :L 6   6 min UE and LE     Lumbar Exercises: Standing   Functional Squats 10 reps;3 seconds   Functional Squats Limitations 10 with 25 pound kettle bell then 10 with 30 pound box  ( no reported incr back pain.)  , then 35 pound box x10  without  incr back pain. , he agreed to add 5 pounds to 40 pounds x10 without incr back pain(   Other Standing Lumbar Exercises blue      Lumbar Exercises: Supine   Bent Knee Raise 20 reps   Bent Knee Raise Limitations Table top with alternating leg lowers     Manual Therapy   Joint Mobilization ER/IR stretching Rt. hip in prone with PA glides to femur , PROM extension 60 sec x1        Ended with prayer stretch x 60 sec            PT Short Term Goals - 11/27/16 0810      PT SHORT TERM GOAL #1   Title Independent with intial HEP   Time 3   Period Weeks   Status Achieved     PT SHORT TERM GOAL #2   Title He will report pain decreased 40% or more with normal activity   Baseline varies but better   Time 3   Period Weeks   Status Partially Met     PT SHORT TERM GOAL #3   Title He will be  able to walk for 20 min or more with min pain   Baseline 15 min   Time 3   Period Weeks   Status On-going     PT SHORT TERM GOAL #4   Title He will be able to stand for 10 min or more with min pain   Baseline 5 Min   Time 3   Period Weeks   Status On-going           PT Long Term Goals - 11/27/16 0813      PT LONG TERM GOAL #1   Title He will be independent with all HEP issued   Time 6   Period Weeks   Status On-going     PT LONG TERM GOAL #2   Title He will return to lifting at work modified load   Time 6   Status On-going     PT LONG TERM GOAL #3   Title He will report able to lift child with 1-2 max pain   Baseline Lifted 64 yr old daughter for first time with no increased pain.    Time 6   Status Partially Met     PT LONG TERM GOAL #4   Title He will demo understanding of good posture sitting and with lifting   Time 6   Period Weeks   Status On-going     PT LONG TERM GOAL #5   Title He will be able to be on feet and walk at work from building to building   Baseline Some, not as much   Time 6   Period Weeks   Status Partially Met     PT LONG TERM  GOAL #6   Title With proper support he sill be able to sit for 60 min for work or leisure with 1-2 max pain   Baseline can sit for  1 hour and then gets up to stretch   Time 6   Period Weeks   Status Achieved               Plan - 11/29/16 0802    Clinical Impression Statement He reported his back felt better and he had a good workout .   He was able to lift more weight without incr pain. Continues to be still in hip and spine. Spot of pain lower lumbar L4-5-S1. Continue per plan. Increase weight as tolerated with lifting   PT Treatment/Interventions Electrical Stimulation;Iontophoresis 72m/ml Dexamethasone;Moist Heat;Ultrasound;Traction;Passive range of motion;Patient/family education;Taping;Manual techniques;Therapeutic exercise;Dry needling   PT Next Visit Plan continue manual , core strength stretching , lifting  add quadruped exercise to HEP   PT Home Exercise Plan  misc hip stretches from sheet and shoulder bridge   Consulted and Agree with Plan of Care Patient      Patient will benefit from skilled therapeutic intervention in order to improve the following deficits and impairments:  Decreased activity tolerance, Pain, Decreased range of motion, Increased muscle spasms, Decreased strength  Visit Diagnosis: Acute bilateral low back pain without sciatica  Stiffness of right hip, not elsewhere classified  Stiffness of left hip, not elsewhere classified  Muscle spasm of back  Joint stiffness of spine     Problem List There are no active problems to display for this patient.   CDarrel Hoover PT 11/29/2016, 8:45 AM  [facility] [address]GRed [place] [address]Phone: [phone]  Fax:  [phone] Name: Fred KUNZLERMRN: [id number]  of Birth: October 12, 1971   PHYSICAL THERAPY DISCHARGE SUMMARY  Visits from Start of Care: 7  Current functional level related to goals / functional  outcomes: Unknown He was improving and saw his MD after this visit and was to continue for 4-5 more visits and did not return  Possibly due to insurance issues.   Remaining deficits: Unknown   Education / Equipment: HEP Plan:                                                    Patient goals were partially met. Patient is being discharged due to not returning since the last visit.  ?????  Lillette Boxer Kilo Eshelman  PT   01/15/16     8:19 AM

## 2016-12-04 ENCOUNTER — Ambulatory Visit: Payer: Worker's Compensation | Admitting: Physical Therapy

## 2016-12-06 ENCOUNTER — Ambulatory Visit (INDEPENDENT_AMBULATORY_CARE_PROVIDER_SITE_OTHER): Payer: Worker's Compensation | Admitting: Physician Assistant

## 2016-12-06 ENCOUNTER — Ambulatory Visit: Payer: Worker's Compensation | Admitting: Physical Therapy

## 2016-12-06 VITALS — BP 124/82 | HR 78 | Temp 98.6°F | Resp 18 | Ht 72.0 in | Wt 286.0 lb

## 2016-12-06 DIAGNOSIS — S39012D Strain of muscle, fascia and tendon of lower back, subsequent encounter: Secondary | ICD-10-CM | POA: Diagnosis not present

## 2016-12-06 NOTE — Progress Notes (Signed)
   12/06/2016 8:23 AM   DOB: 08/22/1971 / MRN: [id number]  SUBJECTIVE:  Luci BankKevin A Hieronymus is a 45 y.o. male presenting for worker's comp follow up of low back pain.  Injury first documented on 10/16/16.   Since that time he has tried PT, NSAIDS and Flexeril.  Today he reports.  Patient reports that he is a 3/10 on the pain.  Reports spasms are much better at this time and says that prolonged sitting and standing is his biggest problem.  Reports that stairs are also a problem.  He is still taking flexeril and ibuprofen.  Takes tramadol  About 1-2 times per week.  Also has been icing and heating.    He has five more visits for physical therapy.  He is going twice a week.    He has No Known Allergies.   He  has no past medical history on file.    He  reports that he has never smoked. He has never used smokeless tobacco. He reports that he drinks alcohol. He reports that he does not use drugs. He  has no sexual activity history on file. The patient  has no past surgical history on file.  His family history is not on file.  Review of Systems  Gastrointestinal: Negative for heartburn.  Musculoskeletal: Positive for back pain and myalgias. Negative for falls, joint pain and neck pain.  Skin: Negative for rash.  Neurological: Negative for tingling, sensory change and focal weakness.    The problem list and medications were reviewed and updated by myself where necessary and exist elsewhere in the encounter.   OBJECTIVE:  BP 124/82 (BP Location: Right Arm, Patient Position: Sitting, Cuff Size: Large)   Pulse 78   Temp 98.6 F (37 C) (Oral)   Resp 18   Ht 6' (1.829 m)   Wt 286 lb (129.7 kg)   SpO2 97%   BMI 38.79 kg/m   Physical Exam  Constitutional: He is oriented to person, place, and time.  Cardiovascular: Normal rate and regular rhythm.   Pulmonary/Chest: Effort normal and breath sounds normal.  Musculoskeletal: Normal range of motion. He exhibits tenderness.       Lumbar back: He  exhibits tenderness. He exhibits no bony tenderness.  Neurological: He is alert and oriented to person, place, and time. He has normal strength and normal reflexes. No cranial nerve deficit or sensory deficit. Gait normal.    No results found for this or any previous visit (from the past 72 hour(s)).  No results found.  ASSESSMENT AND PLAN  [name] was seen today for follow-up.  Diagnoses and all orders for this visit:  Strain of lumbar paraspinal muscle, subsequent encounter: He is 70% better than his initial visit.  He will continue seeing PT. Injury occurred at work.  Will release him to stair climbing but no lifting until follow up on the 5th of January.      The patient is advised to call or return to clinic if he does not see an improvement in symptoms, or to seek the care of the closest emergency department if he worsens with the above plan.   Deliah BostonMichael Darbi Chandran, MHS, PA-C [facility] 12/06/2016 8:23 AM

## 2016-12-06 NOTE — Patient Instructions (Signed)
     IF you received an x-ray today, you will receive an invoice from [facility]. Please contact  Radiology at [phone] with questions or concerns regarding your invoice.   IF you received labwork today, you will receive an invoice from [facility]. Please contact [facility] at [phone] with questions or concerns regarding your invoice.   Our billing staff will not be able to assist you with questions regarding bills from these companies.  You will be contacted with the lab results as soon as they are available. The fastest way to get your results is to activate your My Chart account. Instructions are located on the last page of this paperwork. If you have not heard from us regarding the results in 2 weeks, please contact this office.     

## 2016-12-22 NOTE — Progress Notes (Signed)
Subjective:    Patient ID: Fred BankKevin A Brandow, male    DOB: 11/16/1971, 46 y.o.   MRN: [id number] Chief Complaint  Patient presents with  . Follow-up    workers comp/ back pain    HPI  [name] is a 46 yo male who is here to follow-up on a back injury filed under his worker's comp.  Pt injured his back on 10/16/16 (10 wks prior) when he was lifting a 50 lbs box.  HisPt started on PT about 7 weeks prior.  He has been on light duty at work and using ibuprofen during the day as well as qhs flexeril and tramadol for breakthrough pain. His last visit here was 2-3 wks prior at which point he was still having low back pain pain with prolonged sitting and standing as well as pain with climbing stairs.  [name] reports he is doing better.  He is still having a lot of pain if he is standing in one spot in his right latearl low flank radiating from his central low back.  He feels his feet tingle if he stands to long which has happened prior but is worse.  Gait overall is normal though he does note more right-sided low back pain with lifting and stairs. He has not been lifting any more than 10 lbs and makes sure to extend this to her personal life - will carry in only 1-2 grocery bags at a time.  Not picking up his 525 yo daughter.  Still taking ibuprofen usually just 1x/d. Only used tramadol and flexeril once in the past 2 weeks. Is still doing topical treatment with ice and heat. Flexeril causes GERD for pt.   Past med hx reviewed.  Review of Systems See hpi    Objective:   Physical Exam  Constitutional: He is oriented to person, place, and time. He appears well-developed and well-nourished. No distress.  HENT:  Head: Normocephalic and atraumatic.  Cardiovascular: Intact distal pulses.   Pulmonary/Chest: Effort normal.  Musculoskeletal: Normal range of motion. He exhibits tenderness. He exhibits no edema.       Thoracic back: He exhibits tenderness and spasm. He exhibits normal range of motion, no bony  tenderness, no swelling and no deformity.       Lumbar back: He exhibits tenderness and spasm. He exhibits normal range of motion, no bony tenderness, no edema and no deformity.  Negative straight leg raise bilaterally  Neurological: He is alert and oriented to person, place, and time. He has normal strength and normal reflexes. He displays no atrophy. No sensory deficit. He exhibits normal muscle tone. Coordination and gait normal.  Reflex Scores:      Patellar reflexes are 2+ on the right side and 2+ on the left side.      Achilles reflexes are 2+ on the right side and 2+ on the left side. Skin: Skin is warm and dry. No rash noted. He is not diaphoretic. No erythema.  Psychiatric: He has a normal mood and affect. His behavior is normal.     10/19/16 Lumbar spine xray normal. ,BP 117/67   Pulse 80   Temp 98.7 F (37.1 C) (Oral)   Resp 16   Ht 6' (1.829 m)   Wt 284 lb (128.8 kg)   SpO2 96%   BMI 38.52 kg/m      Assessment & Plan:   1. Strain of lumbar paraspinal muscle, subsequent encounter   Slowly improving. Cont PT. Cont light duty. Recheck in 1 mo.  Norberto Sorenson, M.D.  [facility] [address] [place], [place] [address] [phone] phone [phone] fax  01/17/17 9:41 PM

## 2016-12-23 ENCOUNTER — Encounter: Payer: Self-pay | Admitting: Family Medicine

## 2016-12-23 ENCOUNTER — Ambulatory Visit (INDEPENDENT_AMBULATORY_CARE_PROVIDER_SITE_OTHER): Payer: Worker's Compensation | Admitting: Family Medicine

## 2016-12-23 VITALS — BP 117/67 | HR 80 | Temp 98.7°F | Resp 16 | Ht 72.0 in | Wt 284.0 lb

## 2016-12-23 DIAGNOSIS — S39012D Strain of muscle, fascia and tendon of lower back, subsequent encounter: Secondary | ICD-10-CM | POA: Diagnosis not present

## 2016-12-23 NOTE — Patient Instructions (Addendum)
IF you received an x-ray today, you will receive an invoice from [facility]. Please contact [facility] at [phone] with questions or concerns regarding your invoice.   IF you received labwork today, you will receive an invoice from [place]. Please contact [facility] at [phone] with questions or concerns regarding your invoice.   Our billing staff will not be able to assist you with questions regarding bills from these companies.  You will be contacted with the lab results as soon as they are available. The fastest way to get your results is to activate your My Chart account. Instructions are located on the last page of this paperwork. If you have not heard from [place] regarding the results in 2 weeks, please contact this office.      Low Back Strain Rehab Ask your health care provider which exercises are safe for you. Do exercises exactly as told by your health care provider and adjust them as directed. It is normal to feel mild stretching, pulling, tightness, or discomfort as you do these exercises, but you should stop right away if you feel sudden pain or your pain gets worse. Do not begin these exercises until told by your health care provider. Stretching and range of motion exercises These exercises warm up your muscles and joints and improve the movement and flexibility of your back. These exercises also help to relieve pain, numbness, and tingling. Exercise A: Single knee to chest 1. Lie on your back on a firm surface with both legs straight. 2. Bend one of your knees. Use your hands to move your knee up toward your chest until you feel a gentle stretch in your lower back and buttock.  Hold your leg in this position by holding onto the front of your knee.  Keep your other leg as straight as possible. 3. Hold for __________ seconds. 4. Slowly return to the starting position. 5. Repeat with your other leg. Repeat __________ times. Complete this exercise  __________ times a day. Exercise B: Prone extension on elbows 1. Lie on your abdomen on a firm surface. 2. Prop yourself up on your elbows. 3. Use your arms to help lift your chest up until you feel a gentle stretch in your abdomen and your lower back.  This will place some of your body weight on your elbows. If this is uncomfortable, try stacking pillows under your chest.  Your hips should stay down, against the surface that you are lying on. Keep your hip and back muscles relaxed. 4. Hold for __________ seconds. 5. Slowly relax your upper body and return to the starting position. Repeat __________ times. Complete this exercise __________ times a day. Strengthening exercises These exercises build strength and endurance in your back. Endurance is the ability to use your muscles for a long time, even after they get tired. Exercise C: Pelvic tilt 1. Lie on your back on a firm surface. Bend your knees and keep your feet flat. 2. Tense your abdominal muscles. Tip your pelvis up toward the ceiling and flatten your lower back into the floor.  To help with this exercise, you may place a small towel under your lower back and try to push your back into the towel. 3. Hold for __________ seconds. 4. Let your muscles relax completely before you repeat this exercise. Repeat __________ times. Complete this exercise __________ times a day. Exercise D: Alternating arm and leg raises 1. Get on your hands and knees on a firm surface. If you are  on a hard floor, you may want to use padding to cushion your knees, such as an exercise mat. 2. Line up your arms and legs. Your hands should be below your shoulders, and your knees should be below your hips. 3. Lift your left leg behind you. At the same time, raise your right arm and straighten it in front of you.  Do not lift your leg higher than your hip.  Do not lift your arm higher than your shoulder.  Keep your abdominal and back muscles tight.  Keep your  hips facing the ground.  Do not arch your back.  Keep your balance carefully, and do not hold your breath. 4. Hold for __________ seconds. 5. Slowly return to the starting position and repeat with your right leg and your left arm. Repeat __________ times. Complete this exercise __________times a day. Exercise J: Single leg lower with bent knees 1. Lie on your back on a firm surface. 2. Tense your abdominal muscles and lift your feet off the floor, one foot at a time, so your knees and hips are bent in an "L" shape (at about 90 degrees).  Your knees should be over your hips and your lower legs should be parallel to the floor. 3. Keeping your abdominal muscles tense and your knee bent, slowly lower one of your legs so your toe touches the ground. 4. Lift your leg back up to return to the starting position.  Do not hold your breath.  Do not let your back arch. Keep your back flat against the ground. 5. Repeat with your other leg. Repeat __________ times. Complete this exercise __________ times a day. Posture and body mechanics   Body mechanics refers to the movements and positions of your body while you do your daily activities. Posture is part of body mechanics. Good posture and healthy body mechanics can help to relieve stress in your body's tissues and joints. Good posture means that your spine is in its natural S-curve position (your spine is neutral), your shoulders are pulled back slightly, and your head is not tipped forward. The following are general guidelines for applying improved posture and body mechanics to your everyday activities. Standing   When standing, keep your spine neutral and your feet about hip-width apart. Keep a slight bend in your knees. Your ears, shoulders, and hips should line up.  When you do a task in which you stand in one place for a long time, place one foot up on a stable object that is 2-4 inches (5-10 cm) high, such as a footstool. This helps keep your  spine neutral. Sitting  When sitting, keep your spine neutral and keep your feet flat on the floor. Use a footrest, if necessary, and keep your thighs parallel to the floor. Avoid rounding your shoulders, and avoid tilting your head forward.  When working at a desk or a computer, keep your desk at a height where your hands are slightly lower than your elbows. Slide your chair under your desk so you are close enough to maintain good posture.  When working at a computer, place your monitor at a height where you are looking straight ahead and you do not have to tilt your head forward or downward to look at the screen. Resting   When lying down and resting, avoid positions that are most painful for you.  If you have pain with activities such as sitting, bending, stooping, or squatting (flexion-based activities), lie in a position in which your  body does not bend very much. For example, avoid curling up on your side with your arms and knees near your chest (fetal position).  If you have pain with activities such as standing for a long time or reaching with your arms (extension-based activities), lie with your spine in a neutral position and bend your knees slightly. Try the following positions:  Lying on your side with a pillow between your knees.  Lying on your back with a pillow under your knees. Lifting   When lifting objects, keep your feet at least shoulder-width apart and tighten your abdominal muscles.  Bend your knees and hips and keep your spine neutral. It is important to lift using the strength of your legs, not your back. Do not lock your knees straight out.  Always ask for help to lift heavy or awkward objects. This information is not intended to replace advice given to you by your health care provider. Make sure you discuss any questions you have with your health care provider. Document Released: 12/02/2005 Document Revised: 08/08/2016 Document Reviewed: 09/13/2015 Elsevier  Interactive Patient Education  2017 [employer]

## 2017-01-19 NOTE — Progress Notes (Signed)
Subjective:    Patient ID: Fred Berg, male    DOB: 11/19/1971, 46 y.o.   MRN: [id number] Chief Complaint  Patient presents with  . Follow-up    WORKERS COMP    HPI   Fred Berg is a 46 yo male who is here to follow-up on a back injury filed under his worker's comp.  Pt injured his back on 10/16/16 (3 mos prior) when he was lifting a 50 lbs box.  Pt started on PT within several wks of the injury.  He has been on light duty at work. He has been alternating ice and heat to the low back and using ibuprofen daily as well as rare prn flexeril and tramadol for breakthrough pain. His last visit here was 1 mo prior at which point he was noting slow gradual improvement but was still having right low flank pain radiating laterally from the central low lumbar region with prolonged standing as well as right-sided low back pain with lifting (<10 lbs only due to light-duty restrictions) and climbing stairs.  His feet tingle if he is standing to long.   He has been able to gradually increase his lifting and aerobic exercise.  No sig tingling   Past medical hx inc surgeries, medication, and allergies reviewed.    Review of Systems  Constitutional: Negative for chills and fever.  Gastrointestinal: Negative for abdominal pain, constipation and diarrhea.  Genitourinary: Negative for decreased urine volume, difficulty urinating, frequency and urgency.  Musculoskeletal: Negative for back pain, gait problem and myalgias.  Skin: Negative for color change and wound.  Neurological: Negative for dizziness, weakness, light-headedness and numbness.       Objective:   Physical Exam  Constitutional: He is oriented to person, place, and time. He appears well-developed and well-nourished. No distress.  HENT:  Head: Normocephalic and atraumatic.  Cardiovascular: Intact distal pulses.   Pulmonary/Chest: Effort normal.  Musculoskeletal: Normal range of motion. He exhibits no edema or tenderness.       Thoracic back:  He exhibits normal range of motion, no bony tenderness, no swelling, no deformity and no spasm.       Lumbar back: He exhibits normal range of motion, no bony tenderness, no edema, no deformity and no spasm.  Negative straight leg raise bilaterally  Neurological: He is alert and oriented to person, place, and time. He has normal strength and normal reflexes. He displays no atrophy. No sensory deficit. He exhibits normal muscle tone. Coordination and gait normal.  Reflex Scores:      Patellar reflexes are 2+ on the right side and 2+ on the left side.      Achilles reflexes are 2+ on the right side and 2+ on the left side. Skin: Skin is warm and dry. No rash noted. He is not diaphoretic. No erythema.  Psychiatric: He has a normal mood and affect. His behavior is normal.      BP 116/76 (BP Location: Right Arm, Patient Position: Sitting, Cuff Size: Large)   Pulse 88   Temp 98.7 F (37.1 C) (Oral)   Resp 18   Ht 6' (1.829 m)   Wt 287 lb (130.2 kg)   SpO2 95%   BMI 38.92 kg/m      Assessment & Plan:   1. Strain of lumbar paraspinal muscle, subsequent encounter   Resolved. Pt released back to work full duty. RTC prn.    Norberto SorensonEva Karianne Nogueira, M.D.  Primary Care at [facility]  [place] [address] [place],  [place] [address] [phone] phone [phone] fax  02/07/17 11:32 PM

## 2017-01-20 ENCOUNTER — Ambulatory Visit (INDEPENDENT_AMBULATORY_CARE_PROVIDER_SITE_OTHER): Payer: Worker's Compensation | Admitting: Family Medicine

## 2017-01-20 ENCOUNTER — Encounter: Payer: Self-pay | Admitting: Family Medicine

## 2017-01-20 VITALS — BP 116/76 | HR 88 | Temp 98.7°F | Resp 18 | Ht 72.0 in | Wt 287.0 lb

## 2017-01-20 DIAGNOSIS — S39012D Strain of muscle, fascia and tendon of lower back, subsequent encounter: Secondary | ICD-10-CM | POA: Diagnosis not present

## 2017-01-20 NOTE — Patient Instructions (Signed)
     IF you received an x-ray today, you will receive an invoice from [facility]. Please contact [facility] at [phone] with questions or concerns regarding your invoice.   IF you received labwork today, you will receive an invoice from [facility]. Please contact [facility] at [phone] with questions or concerns regarding your invoice.   Our billing staff will not be able to assist you with questions regarding bills from these companies.  You will be contacted with the lab results as soon as they are available. The fastest way to get your results is to activate your My Chart account. Instructions are located on the last page of this paperwork. If you have not heard from us regarding the results in 2 weeks, please contact this office.     

## 2017-02-25 NOTE — Progress Notes (Signed)
Note reviewed, and agree with documentation and plan.  

## 2018-01-10 IMAGING — DX DG LUMBAR SPINE 2-3V
3 series · 3 of 3 positions shown · non-contrast
Comparison: None.

CLINICAL DATA: Twisting injury at work with low back pain.

EXAM:
LUMBAR SPINE - 2-3 VIEW

[l-spine ap]
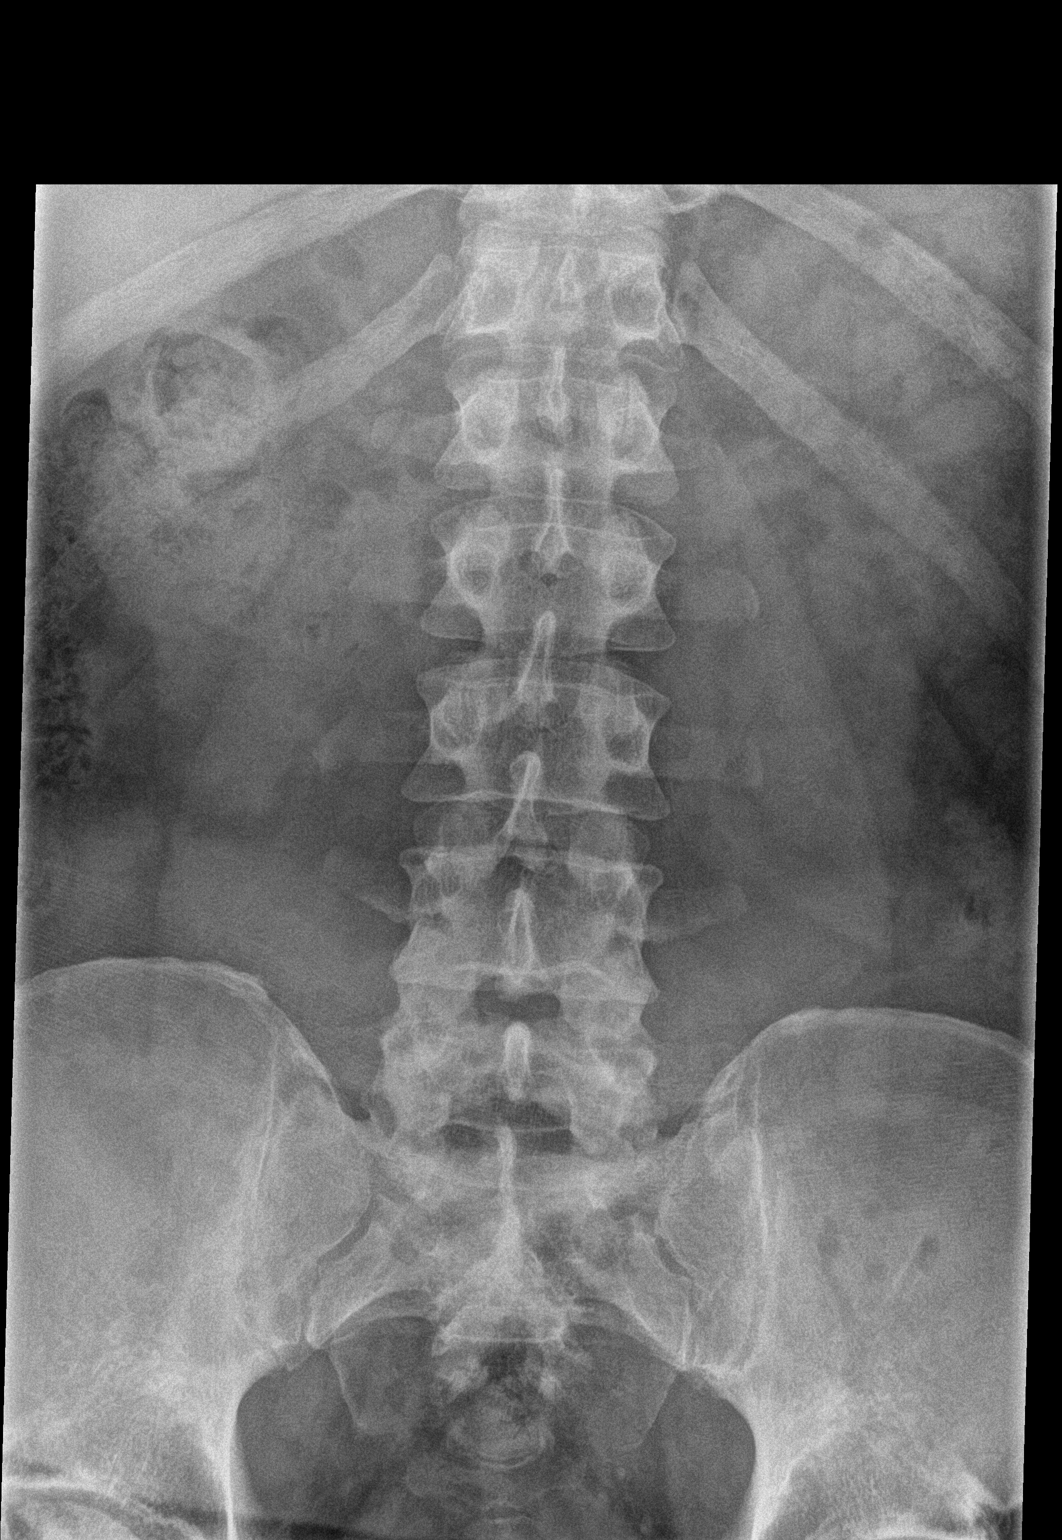

[l-spine lat]
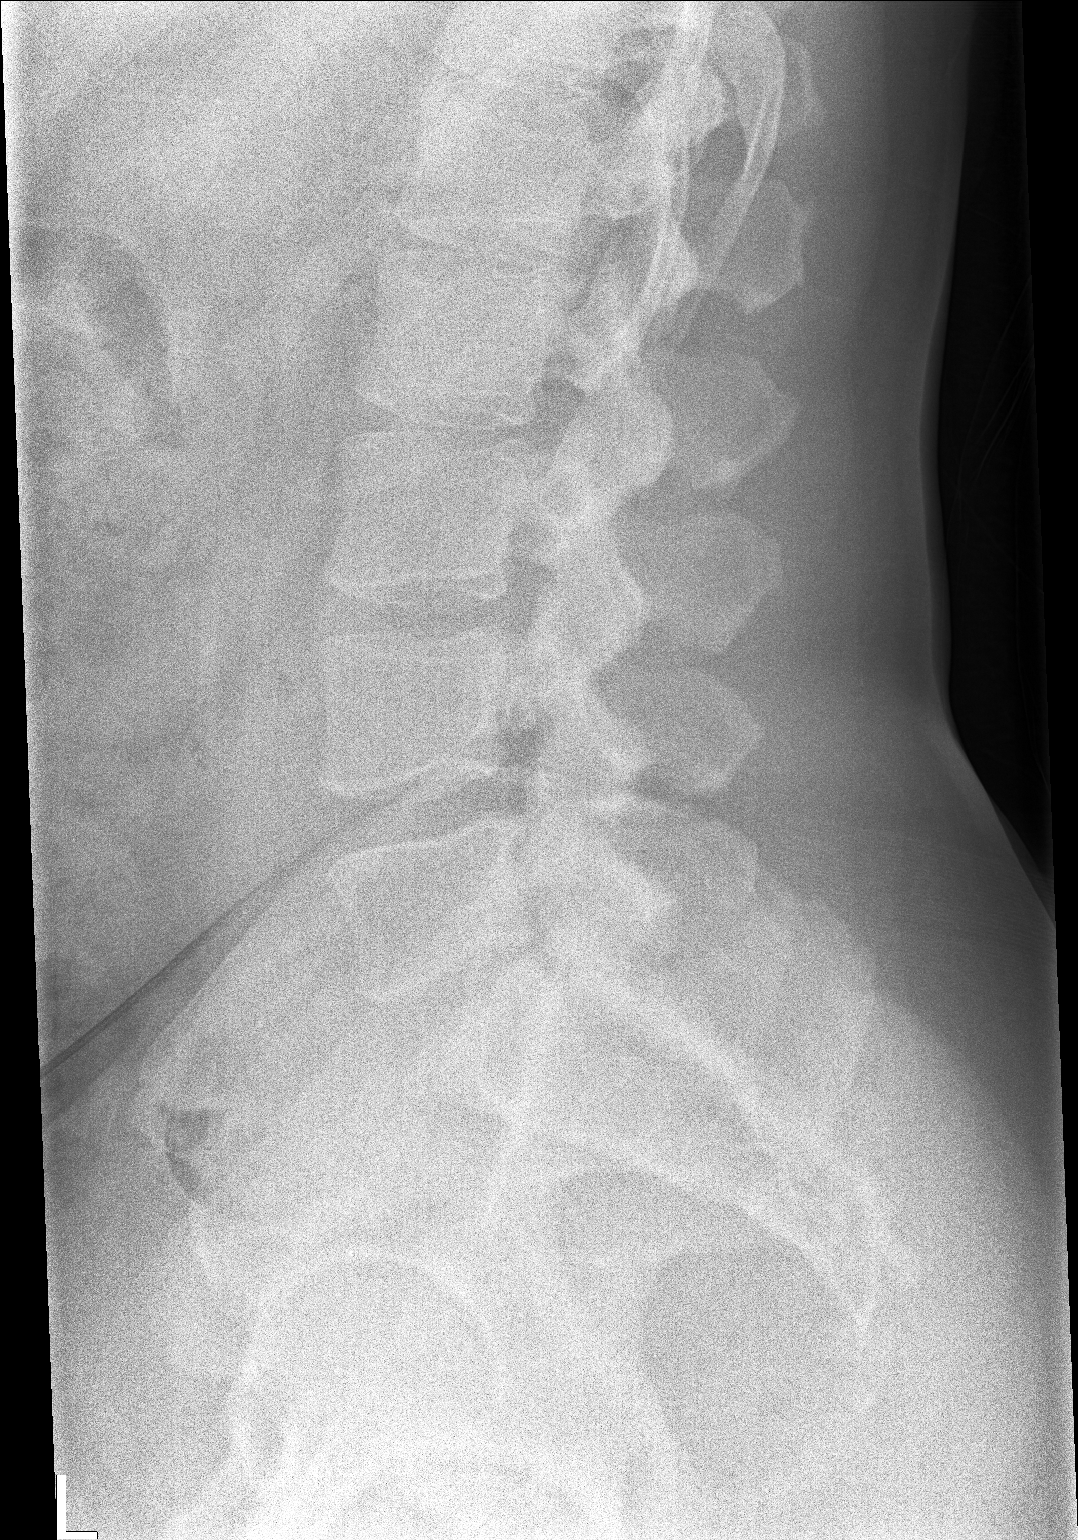

[l-spine l5-s1]
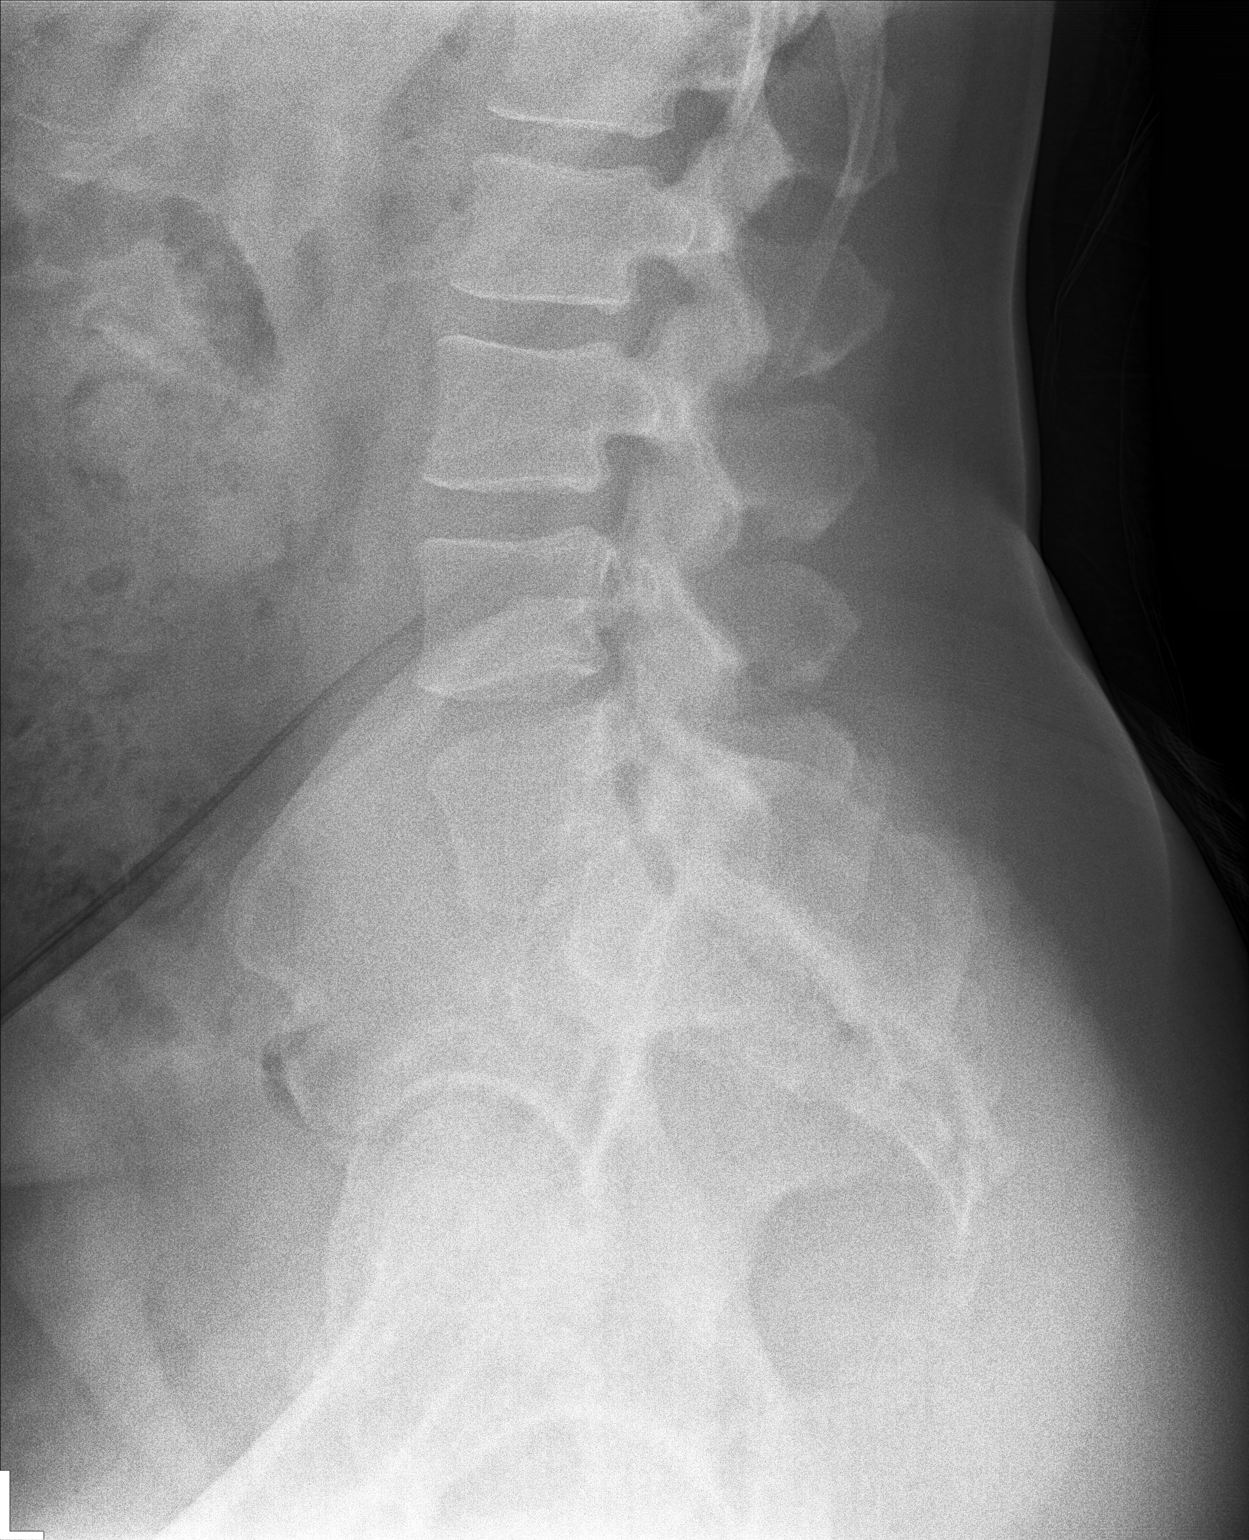

[3 of 3 positions shown; findings below may reference images not displayed]

FINDINGS: There is no evidence of lumbar spine fracture. Alignment is normal.
Intervertebral disc spaces are maintained.
IMPRESSION: Negative.

## 2019-08-04 ENCOUNTER — Other Ambulatory Visit: Payer: Self-pay

## 2019-08-04 ENCOUNTER — Encounter: Payer: Self-pay | Admitting: *Deleted

## 2019-08-04 ENCOUNTER — Encounter: Payer: Self-pay | Admitting: Cardiology

## 2019-08-04 ENCOUNTER — Ambulatory Visit (INDEPENDENT_AMBULATORY_CARE_PROVIDER_SITE_OTHER): Payer: BC Managed Care – PPO | Admitting: Cardiology

## 2019-08-04 VITALS — BP 118/84 | HR 84 | Ht 72.0 in | Wt 290.0 lb

## 2019-08-04 DIAGNOSIS — I209 Angina pectoris, unspecified: Secondary | ICD-10-CM | POA: Insufficient documentation

## 2019-08-04 DIAGNOSIS — J45909 Unspecified asthma, uncomplicated: Secondary | ICD-10-CM | POA: Insufficient documentation

## 2019-08-04 DIAGNOSIS — F4310 Post-traumatic stress disorder, unspecified: Secondary | ICD-10-CM

## 2019-08-04 MED ORDER — METOPROLOL TARTRATE 25 MG PO TABS: 25.0000 mg | ORAL_TABLET | Freq: Two times a day (BID) | ORAL | 1 refills | Status: AC

## 2019-08-04 MED ORDER — NITROGLYCERIN 0.4 MG SL SUBL: 0.4000 mg | SUBLINGUAL_TABLET | SUBLINGUAL | 6 refills | Status: AC | PRN

## 2019-08-04 MED ORDER — ASPIRIN EC 81 MG PO TBEC: 81.0000 mg | DELAYED_RELEASE_TABLET | Freq: Every day | ORAL | 3 refills | Status: AC

## 2019-08-04 NOTE — Progress Notes (Signed)
Cardiology Office Note:    Date:  08/04/2019   ID:  Fred Berg, DOB 1971/03/01, MRN [id number]  PCP:  Mateo Flow, MD  Cardiologist:  Jenean Lindau, MD   Referring MD: Mateo Flow, MD    ASSESSMENT:    1. Angina pectoris ([place])   2. Morbid obesity ([place])    PLAN:    In order of problems listed above:  1. Angina pectoris:In view of the patient's symptoms, I discussed with the patient options for evaluation. Invasive and noninvasive options were given to the patient. I discussed stress testing and coronary angiography and left heart catheterization at length. Benefits, pros and cons of each approach were discussed at length. Patient had multiple questions which were answered to the patient's satisfaction. Patient opted for invasive evaluation and we will set up for coronary angiography and left heart catheterization. Further recommendations will be made based on the findings with coronary angiography. In the interim if the patient has any significant symptoms in hospital to the nearest emergency room. 2. Sublingual nitroglycerin prescription was sent, its protocol and 911 protocol explained and the patient vocalized understanding questions were answered to the patient's satisfaction 3. Diet was discussed for obesity and risks were stressed to the patient he vocalized understanding and plans to do better and lose weight 4. Patient was advised to take a coated 81 mg aspirin on a daily basis.  I have also initiated him on metoprolol 25 mg twice daily tartrate. further recommendations will depend on the findings of the coronary angiography.  He had multiple questions which were answered to his satisfaction.   Medication Adjustments/Labs and Tests Ordered: Current medicines are reviewed at length with the patient today.  Concerns regarding medicines are outlined above.  No orders of the defined types were placed in this encounter.  No orders of the defined types were placed in this  encounter.    History of Present Illness:    Fred Berg is a 48 y.o. male who is being seen today for the evaluation of angina pectoris at the request of Mateo Flow, MD.  Patient is a pleasant 48 year old male.  He has no significant past medical history.  Patient mentions to me that he leads a sedentary lifestyle.  In the past 2 to 3 weeks has been noticing substernal chest tightness on exertion.  This comes consistently on exertion and goes to the neck and sometimes to the arm.  No orthopnea or PND.  He mentions to me that about a week ago he was really little worried because during sexual activity he had the symptoms and he had to stop.  At the time of my evaluation, the patient is alert awake oriented and in no distress.  Past Medical History:  Diagnosis Date  . Asthma   . PTSD (post-traumatic stress disorder)     Past Surgical History:  Procedure Laterality Date  . SHOULDER SURGERY Right 10/2011   Slap repair    Current Medications: Current Meds  Medication Sig  . albuterol (PROVENTIL HFA) 108 (90 Base) MCG/ACT inhaler Inhale 2 puffs into the lungs every 6 (six) hours as needed.  . cetirizine (ZYRTEC) 10 MG tablet Take 10 mg by mouth daily.  Marland Kitchen esomeprazole (NEXIUM) 20 MG capsule Take 20 mg by mouth daily at 12 noon.  . fluticasone (FLONASE) 50 MCG/ACT nasal spray Place into both nostrils daily.  Marland Kitchen ibuprofen (ADVIL) 800 MG tablet Take by mouth daily as needed.  Allergies:   Patient has no known allergies.   Social History   Socioeconomic History  . Marital status: Married    Spouse name: Not on file  . Number of children: Not on file  . Years of education: Not on file  . Highest education level: Not on file  Occupational History  . Not on file  Social Needs  . Financial resource strain: Not on file  . Food insecurity    Worry: Not on file    Inability: Not on file  . Transportation needs    Medical: Not on file    Non-medical: Not on file  Tobacco Use   . Smoking status: Never Smoker  . Smokeless tobacco: Never Used  Substance and Sexual Activity  . Alcohol use: Yes  . Drug use: No  . Sexual activity: Not on file  Lifestyle  . Physical activity    Days per week: Not on file    Minutes per session: Not on file  . Stress: Not on file  Relationships  . Social Musicianconnections    Talks on phone: Not on file    Gets together: Not on file    Attends religious service: Not on file    Active member of club or organization: Not on file    Attends meetings of clubs or organizations: Not on file    Relationship status: Not on file  Other Topics Concern  . Not on file  Social History Narrative  . Not on file     Family History: The patient's family history includes Alcohol abuse in his father; Diabetes in his mother; Lung cancer in his father; Prostate cancer in his paternal grandfather and paternal uncle.  ROS:   Please see the history of present illness.    All other systems reviewed and are negative.  EKGs/Labs/Other Studies Reviewed:    The following studies were reviewed today: EKG reveals sinus rhythm and nonspecific ST-T changes   Recent Labs: No results found for requested labs within last 8760 hours.  Recent Lipid Panel No results found for: CHOL, TRIG, HDL, CHOLHDL, VLDL, LDLCALC, LDLDIRECT  Physical Exam:    VS:  BP 118/84 (BP Location: Left Arm, Patient Position: Sitting, Cuff Size: Normal)   Pulse 84   Ht 6' (1.829 m)   Wt 290 lb (131.5 kg)   SpO2 97%   BMI 39.33 kg/m     Wt Readings from Last 3 Encounters:  08/04/19 290 lb (131.5 kg)  01/20/17 287 lb (130.2 kg)  12/23/16 284 lb (128.8 kg)     GEN: Patient is in no acute distress HEENT: Normal NECK: No JVD; No carotid bruits LYMPHATICS: No lymphadenopathy CARDIAC: S1 S2 regular, 2/6 systolic murmur at the apex. RESPIRATORY:  Clear to auscultation without rales, wheezing or rhonchi  ABDOMEN: Soft, non-tender, non-distended MUSCULOSKELETAL:  No edema; No  deformity  SKIN: Warm and dry NEUROLOGIC:  Alert and oriented x 3 PSYCHIATRIC:  Normal affect    Signed, Garwin Brothersajan R Adore Kithcart, MD  08/04/2019 3:14 PM    [facility]

## 2019-08-04 NOTE — Addendum Note (Signed)
Addended by: Domenik Trice B on: 08/04/2019 03:44 PM   Modules accepted: Orders  

## 2019-08-04 NOTE — Patient Instructions (Addendum)
Medication Instructions:  Your physician has recommended you make the following change in your medication:   START Aspirin 81 mg: Take 1 daily START:  Metoprolol 25 mg : Take 1 tab twice daily  Nitroglycerin 0.4 mg sublingual (under your tongue) as needed for chest pain. If experiencing chest pain, stop what you are doing and sit down. Take 1 nitroglycerin and wait 5 minutes. If chest pain continues, take another nitroglycerin and wait 5 minutes. If chest pain does not subside, take 1 more nitroglycerin and dial 911. You make take a total of 3 nitroglycerin in a 15 minute time frame.    If you need a refill on your cardiac medications before your next appointment, please call your pharmacy.   Lab work: Your physician recommends that you return for lab work in: Today BMP,CBC  If you have labs (blood work) drawn today and your tests are completely normal, you will receive your results only by: Marland Kitchen MyChart Message (if you have MyChart) OR . A paper copy in the mail If you have any lab test that is abnormal or we need to change your treatment, we will call you to review the results.  Testing/Procedures:    [name] [id number] Dept: [phone] Loc: [phone]  Fred Berg  08/04/2019  You are scheduled for a Cardiac Catheterization on Monday, August 24 with Dr. Shelva Majestic.  1. Please arrive at the [facility] (Main Entrance A) at [facility]: [address] [place], [place] [address] at 7:00 AM (This time is two hours before your procedure to ensure your preparation). Free valet parking service is available.   Special note: Every effort is made to have your procedure done on time. Please understand that emergencies sometimes delay scheduled procedures.  2. Diet: Do not eat solid foods after midnight.  The patient may have clear liquids until 5am upon the day of  the procedure.  3. Labs: None needed  On the morning of your procedure, take your Aspirin and any morning medicines NOT listed above.  You may use sips of water.  5. Plan for one night stay--bring personal belongings. 6. Bring a current list of your medications and current insurance cards. 7. You MUST have a responsible person to drive you home. 8. Someone MUST be with you the first 24 hours after you arrive home or your discharge will be delayed. 9. Please wear clothes that are easy to get on and off and wear slip-on shoes.  Thank you for allowing [place] to care for you!   -- [facility]  Follow-Up: At [facility], you and your health needs are our priority.  As part of our continuing mission to provide you with exceptional heart care, we have created designated Provider Care Teams.  These Care Teams include your primary Cardiologist (physician) and Advanced Practice Providers (APPs -  Physician Assistants and Nurse Practitioners) who all work together to provide you with the care you need, when you need it. You will need a follow up appointment in 3 weeks.  Please call our office 2 months in advance to schedule this appointment.  You may see No primary care provider on file. or another member of our [employer] Provider Team in [place]: Jenne Campus, MD . Shirlee More, MD  Any Other Special Instructions Will Be Listed Below (If Applicable).   YOU HAVE A SCREENING FOR COVID SCHEDULED  08/06/2019 at 12:40 at [facility]. [address] [place]

## 2019-08-04 NOTE — H&P (View-Only) (Signed)
Cardiology Office Note:    Date:  08/04/2019   ID:  Fred Berg, DOB 1971/03/01, MRN [id number]  PCP:  Mateo Flow, MD  Cardiologist:  Jenean Lindau, MD   Referring MD: Mateo Flow, MD    ASSESSMENT:    1. Angina pectoris ([place])   2. Morbid obesity ([place])    PLAN:    In order of problems listed above:  1. Angina pectoris:In view of the patient's symptoms, I discussed with the patient options for evaluation. Invasive and noninvasive options were given to the patient. I discussed stress testing and coronary angiography and left heart catheterization at length. Benefits, pros and cons of each approach were discussed at length. Patient had multiple questions which were answered to the patient's satisfaction. Patient opted for invasive evaluation and we will set up for coronary angiography and left heart catheterization. Further recommendations will be made based on the findings with coronary angiography. In the interim if the patient has any significant symptoms in hospital to the nearest emergency room. 2. Sublingual nitroglycerin prescription was sent, its protocol and 911 protocol explained and the patient vocalized understanding questions were answered to the patient's satisfaction 3. Diet was discussed for obesity and risks were stressed to the patient he vocalized understanding and plans to do better and lose weight 4. Patient was advised to take a coated 81 mg aspirin on a daily basis.  I have also initiated him on metoprolol 25 mg twice daily tartrate. further recommendations will depend on the findings of the coronary angiography.  He had multiple questions which were answered to his satisfaction.   Medication Adjustments/Labs and Tests Ordered: Current medicines are reviewed at length with the patient today.  Concerns regarding medicines are outlined above.  No orders of the defined types were placed in this encounter.  No orders of the defined types were placed in this  encounter.    History of Present Illness:    Fred Berg is a 48 y.o. male who is being seen today for the evaluation of angina pectoris at the request of Mateo Flow, MD.  Patient is a pleasant 48 year old male.  He has no significant past medical history.  Patient mentions to me that he leads a sedentary lifestyle.  In the past 2 to 3 weeks has been noticing substernal chest tightness on exertion.  This comes consistently on exertion and goes to the neck and sometimes to the arm.  No orthopnea or PND.  He mentions to me that about a week ago he was really little worried because during sexual activity he had the symptoms and he had to stop.  At the time of my evaluation, the patient is alert awake oriented and in no distress.  Past Medical History:  Diagnosis Date  . Asthma   . PTSD (post-traumatic stress disorder)     Past Surgical History:  Procedure Laterality Date  . SHOULDER SURGERY Right 10/2011   Slap repair    Current Medications: Current Meds  Medication Sig  . albuterol (PROVENTIL HFA) 108 (90 Base) MCG/ACT inhaler Inhale 2 puffs into the lungs every 6 (six) hours as needed.  . cetirizine (ZYRTEC) 10 MG tablet Take 10 mg by mouth daily.  Marland Kitchen esomeprazole (NEXIUM) 20 MG capsule Take 20 mg by mouth daily at 12 noon.  . fluticasone (FLONASE) 50 MCG/ACT nasal spray Place into both nostrils daily.  Marland Kitchen ibuprofen (ADVIL) 800 MG tablet Take by mouth daily as needed.  Allergies:   Patient has no known allergies.   Social History   Socioeconomic History  . Marital status: Married    Spouse name: Not on file  . Number of children: Not on file  . Years of education: Not on file  . Highest education level: Not on file  Occupational History  . Not on file  Social Needs  . Financial resource strain: Not on file  . Food insecurity    Worry: Not on file    Inability: Not on file  . Transportation needs    Medical: Not on file    Non-medical: Not on file  Tobacco Use   . Smoking status: Never Smoker  . Smokeless tobacco: Never Used  Substance and Sexual Activity  . Alcohol use: Yes  . Drug use: No  . Sexual activity: Not on file  Lifestyle  . Physical activity    Days per week: Not on file    Minutes per session: Not on file  . Stress: Not on file  Relationships  . Social connections    Talks on phone: Not on file    Gets together: Not on file    Attends religious service: Not on file    Active member of club or organization: Not on file    Attends meetings of clubs or organizations: Not on file    Relationship status: Not on file  Other Topics Concern  . Not on file  Social History Narrative  . Not on file     Family History: The patient's family history includes Alcohol abuse in his father; Diabetes in his mother; Lung cancer in his father; Prostate cancer in his paternal grandfather and paternal uncle.  ROS:   Please see the history of present illness.    All other systems reviewed and are negative.  EKGs/Labs/Other Studies Reviewed:    The following studies were reviewed today: EKG reveals sinus rhythm and nonspecific ST-T changes   Recent Labs: No results found for requested labs within last 8760 hours.  Recent Lipid Panel No results found for: CHOL, TRIG, HDL, CHOLHDL, VLDL, LDLCALC, LDLDIRECT  Physical Exam:    VS:  BP 118/84 (BP Location: Left Arm, Patient Position: Sitting, Cuff Size: Normal)   Pulse 84   Ht 6' (1.829 m)   Wt 290 lb (131.5 kg)   SpO2 97%   BMI 39.33 kg/m     Wt Readings from Last 3 Encounters:  08/04/19 290 lb (131.5 kg)  01/20/17 287 lb (130.2 kg)  12/23/16 284 lb (128.8 kg)     GEN: Patient is in no acute distress HEENT: Normal NECK: No JVD; No carotid bruits LYMPHATICS: No lymphadenopathy CARDIAC: S1 S2 regular, 2/6 systolic murmur at the apex. RESPIRATORY:  Clear to auscultation without rales, wheezing or rhonchi  ABDOMEN: Soft, non-tender, non-distended MUSCULOSKELETAL:  No edema; No  deformity  SKIN: Warm and dry NEUROLOGIC:  Alert and oriented x 3 PSYCHIATRIC:  Normal affect    Signed, Damarrion Mimbs R Leam Madero, MD  08/04/2019 3:14 PM    [facility]   

## 2019-08-05 ENCOUNTER — Other Ambulatory Visit (HOSPITAL_COMMUNITY): Payer: BC Managed Care – PPO

## 2019-08-05 ENCOUNTER — Encounter: Payer: Self-pay | Admitting: *Deleted

## 2019-08-05 ENCOUNTER — Telehealth: Payer: Self-pay | Admitting: *Deleted

## 2019-08-05 LAB — BASIC METABOLIC PANEL
BUN/Creatinine Ratio: 12 (ref 9–20)
BUN: 12 mg/dL (ref 6–24)
CO2: 22 mmol/L (ref 20–29)
Calcium: 10.1 mg/dL (ref 8.7–10.2)
Chloride: 100 mmol/L (ref 96–106)
Creatinine, Ser: 0.99 mg/dL (ref 0.76–1.27)
GFR calc Af Amer: 102 mL/min/{1.73_m2} (ref >59)
GFR calc non Af Amer: 88 mL/min/{1.73_m2} (ref >59)
Glucose: 86 mg/dL (ref 65–99)
Potassium: 4.3 mmol/L (ref 3.5–5.2)
Sodium: 138 mmol/L (ref 134–144)

## 2019-08-05 LAB — CBC
Hematocrit: 45.1 % (ref 37.5–51.0)
Hemoglobin: 14.6 g/dL (ref 13.0–17.7)
MCH: 29.2 pg (ref 26.6–33.0)
MCHC: 32.4 g/dL (ref 31.5–35.7)
MCV: 90 fL (ref 79–97)
Platelets: 208 10*3/uL (ref 150–450)
RBC: 5 x10E6/uL (ref 4.14–5.80)
RDW: 13.1 % (ref 11.6–15.4)
WBC: 5.5 10*3/uL (ref 3.4–10.8)

## 2019-08-05 NOTE — Telephone Encounter (Signed)
Pt contacted pre-catheterization scheduled at [facility] for: Monday August 09, 2019 9 AM Verified arrival time and place: [facility]) at: 7 AM   No solid food after midnight prior to cath, clear liquids until 5 AM day of procedure. Contrast allergy: no   AM meds can be  taken pre-cath with sip of water including: ASA 81 mg   Confirmed patient has responsible person to drive home post procedure and observe 24 hours after arriving home: yes  Currently, due to Covid-19 pandemic, only one support person will be allowed with patient. Must be the same support person for that patient's entire stay, will be screened and required to wear a mask. They will be asked to wait in the waiting room for the duration of the patient's stay.  Patients are required to wear a mask when they enter the hospital.       COVID-19 Pre-Screening Questions:  . In the past 7 to 10 days have you had a cough,  shortness of breath, headache, congestion, fever (100 or greater) body aches, chills, sore throat, or sudden loss of taste or sense of smell? no . Have you been around anyone with known Covid 19? no . Have you been around anyone who is awaiting Covid 19 test results in the past 7 to 10 days? no . Have you been around anyone who has been exposed to Covid 19, or has mentioned symptoms of Covid 19 within the past 7 to 10 days? no  I reviewed procedure/mask/visitor, Covid 19 screening questions with patient, he verbalized understanding, thanked me for call.

## 2019-08-06 ENCOUNTER — Other Ambulatory Visit (HOSPITAL_COMMUNITY)
Admission: RE | Admit: 2019-08-06 | Discharge: 2019-08-06 | Disposition: A | Discharge status: Home / Self Care | Payer: BC Managed Care – PPO | Source: Ambulatory Visit | Attending: Cardiovascular Disease | Admitting: Cardiovascular Disease

## 2019-08-06 ENCOUNTER — Telehealth: Payer: Self-pay | Admitting: Cardiology

## 2019-08-06 DIAGNOSIS — Z01812 Encounter for preprocedural laboratory examination: Secondary | ICD-10-CM | POA: Diagnosis present

## 2019-08-06 DIAGNOSIS — Z20828 Contact with and (suspected) exposure to other viral communicable diseases: Secondary | ICD-10-CM | POA: Insufficient documentation

## 2019-08-06 LAB — SARS CORONAVIRUS 2 (TAT 6-24 HRS): SARS Coronavirus 2: NEGATIVE

## 2019-08-06 NOTE — Telephone Encounter (Signed)
Has questions about being out of work and a note for that

## 2019-08-09 ENCOUNTER — Ambulatory Visit (HOSPITAL_COMMUNITY)
Admission: RE | Admit: 2019-08-09 | Discharge: 2019-08-09 | Disposition: A | Discharge status: Home / Self Care | Payer: BC Managed Care – PPO | Attending: Cardiovascular Disease | Admitting: Cardiovascular Disease

## 2019-08-09 ENCOUNTER — Other Ambulatory Visit: Payer: Self-pay

## 2019-08-09 ENCOUNTER — Encounter (HOSPITAL_COMMUNITY): Payer: Self-pay | Admitting: Cardiovascular Disease

## 2019-08-09 ENCOUNTER — Encounter (HOSPITAL_COMMUNITY)
Admission: RE | Disposition: A | Discharge status: Home / Self Care | Payer: Self-pay | Source: Home / Self Care | Attending: Cardiovascular Disease

## 2019-08-09 DIAGNOSIS — Z6839 Body mass index (BMI) 39.0-39.9, adult: Secondary | ICD-10-CM | POA: Insufficient documentation

## 2019-08-09 DIAGNOSIS — I209 Angina pectoris, unspecified: Secondary | ICD-10-CM

## 2019-08-09 DIAGNOSIS — J45909 Unspecified asthma, uncomplicated: Secondary | ICD-10-CM | POA: Insufficient documentation

## 2019-08-09 DIAGNOSIS — Z79899 Other long term (current) drug therapy: Secondary | ICD-10-CM | POA: Diagnosis not present

## 2019-08-09 HISTORY — PX: LEFT HEART CATH AND CORONARY ANGIOGRAPHY: CATH118249

## 2019-08-09 SURGERY — LEFT HEART CATH AND CORONARY ANGIOGRAPHY
Anesthesia: LOCAL

## 2019-08-09 MED ORDER — MIDAZOLAM HCL 2 MG/2ML IJ SOLN
INTRAMUSCULAR | Status: DC | PRN
  Administered 2019-08-09: 2 mg via INTRAVENOUS

## 2019-08-09 MED ORDER — SODIUM CHLORIDE 0.9% FLUSH: 3.0000 mL | INTRAVENOUS | Status: DC | PRN

## 2019-08-09 MED ORDER — IOHEXOL 350 MG/ML SOLN
INTRAVENOUS | Status: DC | PRN
  Administered 2019-08-09: 85 mL via INTRA_ARTERIAL

## 2019-08-09 MED ORDER — SODIUM CHLORIDE 0.9% FLUSH: 3.0000 mL | Freq: Two times a day (BID) | INTRAVENOUS | Status: DC

## 2019-08-09 MED ORDER — VERAPAMIL HCL 2.5 MG/ML IV SOLN
INTRAVENOUS | Status: DC | PRN
  Administered 2019-08-09: 10 mL via INTRA_ARTERIAL

## 2019-08-09 MED ORDER — ONDANSETRON HCL 4 MG/2ML IJ SOLN: 4.0000 mg | Freq: Four times a day (QID) | INTRAMUSCULAR | Status: DC | PRN

## 2019-08-09 MED ORDER — LABETALOL HCL 5 MG/ML IV SOLN: 10.0000 mg | INTRAVENOUS | Status: DC | PRN

## 2019-08-09 MED ORDER — SODIUM CHLORIDE 0.9 % IV SOLN: 250.0000 mL | INTRAVENOUS | Status: DC | PRN

## 2019-08-09 MED ORDER — SODIUM CHLORIDE 0.9 % IV SOLN: INTRAVENOUS | Status: DC

## 2019-08-09 MED ORDER — ASPIRIN 81 MG PO CHEW: 81.0000 mg | CHEWABLE_TABLET | ORAL | Status: DC

## 2019-08-09 MED ORDER — LIDOCAINE HCL (PF) 1 % IJ SOLN
INTRAMUSCULAR | Status: DC | PRN
  Administered 2019-08-09: 2 mL

## 2019-08-09 MED ORDER — HYDRALAZINE HCL 20 MG/ML IJ SOLN: 10.0000 mg | INTRAMUSCULAR | Status: DC | PRN

## 2019-08-09 MED ORDER — HEPARIN (PORCINE) IN NACL 1000-0.9 UT/500ML-% IV SOLN
INTRAVENOUS | Status: DC | PRN
  Administered 2019-08-09 (×2): 500 mL

## 2019-08-09 MED ORDER — DIAZEPAM 5 MG PO TABS: 5.0000 mg | ORAL_TABLET | ORAL | Status: DC | PRN

## 2019-08-09 MED ORDER — SODIUM CHLORIDE 0.9 % WEIGHT BASED INFUSION: 1.0000 mL/kg/h | INTRAVENOUS | Status: DC

## 2019-08-09 MED ORDER — ACETAMINOPHEN 325 MG PO TABS: 650.0000 mg | ORAL_TABLET | ORAL | Status: DC | PRN

## 2019-08-09 MED ORDER — HEPARIN SODIUM (PORCINE) 1000 UNIT/ML IJ SOLN
INTRAMUSCULAR | Status: DC | PRN
  Administered 2019-08-09: 6000 [IU] via INTRAVENOUS

## 2019-08-09 MED ORDER — SODIUM CHLORIDE 0.9 % WEIGHT BASED INFUSION
3.0000 mL/kg/h | INTRAVENOUS | Status: DC
  Administered 2019-08-09: 3 mL/kg/h via INTRAVENOUS

## 2019-08-09 MED ORDER — ASPIRIN 81 MG PO CHEW: 81.0000 mg | CHEWABLE_TABLET | Freq: Every day | ORAL | Status: DC

## 2019-08-09 MED ORDER — FENTANYL CITRATE (PF) 100 MCG/2ML IJ SOLN
INTRAMUSCULAR | Status: DC | PRN
  Administered 2019-08-09: 50 ug via INTRAVENOUS

## 2019-08-09 SURGICAL SUPPLY — 16 items
CATH 5FR JL3.5 JR4 ANG PIG MP (CATHETERS) ×1 IMPLANT
CATH INFINITI 5F JL4 125CM (CATHETERS) ×1 IMPLANT
CATH INFINITI 5F PIG 125CM (CATHETERS) ×1 IMPLANT
CATH OPTITORQUE TIG 4.0 5F (CATHETERS) ×1 IMPLANT
DEVICE RAD COMP TR BAND LRG (VASCULAR PRODUCTS) ×1 IMPLANT
GLIDESHEATH SLEND SS 6F .021 (SHEATH) ×1 IMPLANT
GUIDEWIRE INQWIRE 1.5J.035X260 (WIRE) IMPLANT
INQWIRE 1.5J .035X260CM (WIRE) ×2
KIT HEART LEFT (KITS) ×2 IMPLANT
PACK CARDIAC CATHETERIZATION (CUSTOM PROCEDURE TRAY) ×2 IMPLANT
SHEATH PINNACLE 5F 10CM (SHEATH) IMPLANT
SYR MEDRAD MARK 7 150ML (SYRINGE) ×1 IMPLANT
TRANSDUCER W/STOPCOCK (MISCELLANEOUS) ×2 IMPLANT
TUBING CIL FLEX 10 FLL-RA (TUBING) ×2 IMPLANT
WIRE EMERALD 3MM-J .035X150CM (WIRE) IMPLANT
WIRE HI TORQ VERSACORE-J 145CM (WIRE) ×1 IMPLANT

## 2019-08-09 NOTE — Interval H&P Note (Signed)
Cath Lab Visit (complete for each Cath Lab visit)  Clinical Evaluation Leading to the Procedure:   ACS: No.  Non-ACS:    Anginal Classification: CCS III  Anti-ischemic medical therapy: Minimal Therapy (1 class of medications)  Non-Invasive Test Results: No non-invasive testing performed  Prior CABG: No previous CABG      History and Physical Interval Note:  08/09/2019 9:04 AM  Fred Berg  has presented today for surgery, with the diagnosis of angina.  The various methods of treatment have been discussed with the patient and family. After consideration of risks, benefits and other options for treatment, the patient has consented to  Procedure(s): LEFT HEART CATH AND CORONARY ANGIOGRAPHY (N/A) as a surgical intervention.  The patient's history has been reviewed, patient examined, no change in status, stable for surgery.  I have reviewed the patient's chart and labs.  Questions were answered to the patient's satisfaction.     Shelva Majestic

## 2019-08-09 NOTE — Discharge Instructions (Signed)
Radial Site Care ° °This sheet gives you information about how to care for yourself after your procedure. Your health care provider may also give you more specific instructions. If you have problems or questions, contact your health care provider. °What can I expect after the procedure? °After the procedure, it is common to have: °· Bruising and tenderness at the catheter insertion area. °Follow these instructions at home: °Medicines °· Take over-the-counter and prescription medicines only as told by your health care provider. °Insertion site care °· Follow instructions from your health care provider about how to take care of your insertion site. Make sure you: °? Wash your hands with soap and water before you change your bandage (dressing). If soap and water are not available, use hand sanitizer. °? Change your dressing as told by your health care provider. °? Leave stitches (sutures), skin glue, or adhesive strips in place. These skin closures may need to stay in place for 2 weeks or longer. If adhesive strip edges start to loosen and curl up, you may trim the loose edges. Do not remove adhesive strips completely unless your health care provider tells you to do that. °· Check your insertion site every day for signs of infection. Check for: °? Redness, swelling, or pain. °? Fluid or blood. °? Pus or a bad smell. °? Warmth. °· Do not take baths, swim, or use a hot tub until your health care provider approves. °· You may shower 24-48 hours after the procedure, or as directed by your health care provider. °? Remove the dressing and gently wash the site with plain soap and water. °? Pat the area dry with a clean towel. °? Do not rub the site. That could cause bleeding. °· Do not apply powder or lotion to the site. °Activity ° °· For 24 hours after the procedure, or as directed by your health care provider: °? Do not flex or bend the affected arm. °? Do not push or pull heavy objects with the affected arm. °? Do not  drive yourself home from the hospital or clinic. You may drive 24 hours after the procedure unless your health care provider tells you not to. °? Do not operate machinery or power tools. °· Do not lift anything that is heavier than 10 lb (4.5 kg), or the limit that you are told, until your health care provider says that it is safe. °· Ask your health care provider when it is okay to: °? Return to work or school. °? Resume usual physical activities or sports. °? Resume sexual activity. °General instructions °· If the catheter site starts to bleed, raise your arm and put firm pressure on the site. If the bleeding does not stop, get help right away. This is a medical emergency. °· If you went home on the same day as your procedure, a responsible adult should be with you for the first 24 hours after you arrive home. °· Keep all follow-up visits as told by your health care provider. This is important. °Contact a health care provider if: °· You have a fever. °· You have redness, swelling, or yellow drainage around your insertion site. °Get help right away if: °· You have unusual pain at the radial site. °· The catheter insertion area swells very fast. °· The insertion area is bleeding, and the bleeding does not stop when you hold steady pressure on the area. °· Your arm or hand becomes pale, cool, tingly, or numb. °These symptoms may represent a serious problem   that is an emergency. Do not wait to see if the symptoms will go away. Get medical help right away. Call your local emergency services (911 in the [place] Do not drive yourself to the hospital. °Summary °· After the procedure, it is common to have bruising and tenderness at the site. °· Follow instructions from your health care provider about how to take care of your radial site wound. Check the wound every day for signs of infection. °· Do not lift anything that is heavier than 10 lb (4.5 kg), or the limit that you are told, until your health care provider says  that it is safe. °This information is not intended to replace advice given to you by your health care provider. Make sure you discuss any questions you have with your health care provider. °Document Released: 01/04/2011 Document Revised: 01/07/2018 Document Reviewed: 01/07/2018 °Elsevier Patient Education © 2020 [employer] ° °

## 2019-08-09 NOTE — Telephone Encounter (Signed)
Attempted to call patient back but mailbox is full and no messages accepted at this time. Will continue to try and reach patient later.

## 2019-08-12 ENCOUNTER — Telehealth: Payer: Self-pay | Admitting: Cardiology

## 2019-08-12 NOTE — Telephone Encounter (Signed)
Patient states he is not feeling any improvements since his procedure. He is still having some SOB with intermittent chest tightness. He needs Dr. Docia Furl to review his information and advise???Also patient is coming by today with FMLA paperwork for him and his wife because he is still out of work. RN instructed him to drop off between 4-4:30 and advised that there is a fee for [place].

## 2019-08-12 NOTE — Telephone Encounter (Signed)
Patient dropped off FMLA paperwork for 08/07/19-08/13/19 due to cardiac cath. RN spoke with Dr. Docia Furl and reviewed cath report with him over telephone. Work note given to patients wife and FMLA paperwork forwarded to L.Judeth Horn. Patient instructed to f/u with Dr. Humphrey Rolls concerning his prolonged chest pains. Copy of all recent procedures, last office note and labs sent to PCP.

## 2019-08-12 NOTE — Telephone Encounter (Signed)
Still having CP after his procedure on Monday

## 2019-08-14 NOTE — Telephone Encounter (Signed)
As discussed

## 2019-09-21 ENCOUNTER — Ambulatory Visit: Payer: BC Managed Care – PPO | Admitting: Cardiology
